# Patient Record
Sex: Male | Born: 1965 | Race: White | Hispanic: No | Marital: Single | Smoking: Current every day smoker
Health system: Southern US, Community
[De-identification: ages and names within clinical notes are randomized; demographics above are authoritative.]

## PROBLEM LIST (undated history)

## (undated) DIAGNOSIS — H9190 Unspecified hearing loss, unspecified ear: Secondary | ICD-10-CM

## (undated) HISTORY — PX: APPENDECTOMY: SHX54

## (undated) HISTORY — PX: COSMETIC SURGERY: SHX468

---

## 1998-02-19 ENCOUNTER — Emergency Department (HOSPITAL_COMMUNITY): Admission: EM | Admit: 1998-02-19 | Discharge: 1998-02-19 | Payer: Self-pay | Admitting: Emergency Medicine

## 1999-07-03 ENCOUNTER — Emergency Department (HOSPITAL_COMMUNITY): Admission: EM | Admit: 1999-07-03 | Discharge: 1999-07-03 | Payer: Self-pay | Admitting: Emergency Medicine

## 1999-07-03 ENCOUNTER — Encounter: Payer: Self-pay | Admitting: Emergency Medicine

## 2002-06-04 ENCOUNTER — Observation Stay (HOSPITAL_COMMUNITY): Admission: EM | Admit: 2002-06-04 | Discharge: 2002-06-04 | Payer: Self-pay | Admitting: Emergency Medicine

## 2002-06-09 ENCOUNTER — Encounter: Payer: Self-pay | Admitting: Emergency Medicine

## 2002-06-09 ENCOUNTER — Emergency Department (HOSPITAL_COMMUNITY): Admission: EM | Admit: 2002-06-09 | Discharge: 2002-06-09 | Payer: Self-pay | Admitting: Emergency Medicine

## 2002-06-12 ENCOUNTER — Emergency Department (HOSPITAL_COMMUNITY): Admission: EM | Admit: 2002-06-12 | Discharge: 2002-06-12 | Payer: Self-pay | Admitting: Emergency Medicine

## 2002-06-29 ENCOUNTER — Emergency Department (HOSPITAL_COMMUNITY): Admission: EM | Admit: 2002-06-29 | Discharge: 2002-06-29 | Payer: Self-pay | Admitting: Emergency Medicine

## 2002-07-02 ENCOUNTER — Emergency Department (HOSPITAL_COMMUNITY): Admission: EM | Admit: 2002-07-02 | Discharge: 2002-07-02 | Payer: Self-pay | Admitting: Emergency Medicine

## 2002-07-03 ENCOUNTER — Emergency Department (HOSPITAL_COMMUNITY): Admission: EM | Admit: 2002-07-03 | Discharge: 2002-07-03 | Payer: Self-pay | Admitting: Emergency Medicine

## 2002-07-03 ENCOUNTER — Encounter: Payer: Self-pay | Admitting: Emergency Medicine

## 2003-08-23 ENCOUNTER — Emergency Department (HOSPITAL_COMMUNITY): Admission: EM | Admit: 2003-08-23 | Discharge: 2003-08-23 | Payer: Self-pay | Admitting: Emergency Medicine

## 2003-08-26 ENCOUNTER — Emergency Department (HOSPITAL_COMMUNITY): Admission: EM | Admit: 2003-08-26 | Discharge: 2003-08-26 | Payer: Self-pay | Admitting: Emergency Medicine

## 2003-08-26 ENCOUNTER — Encounter: Payer: Self-pay | Admitting: Emergency Medicine

## 2003-09-01 ENCOUNTER — Emergency Department (HOSPITAL_COMMUNITY): Admission: AD | Admit: 2003-09-01 | Discharge: 2003-09-01 | Payer: Self-pay | Admitting: Emergency Medicine

## 2003-09-16 ENCOUNTER — Ambulatory Visit (HOSPITAL_COMMUNITY): Admission: RE | Admit: 2003-09-16 | Discharge: 2003-09-16 | Payer: Self-pay | Admitting: *Deleted

## 2003-09-27 ENCOUNTER — Encounter: Admission: RE | Admit: 2003-09-27 | Discharge: 2003-09-27 | Payer: Self-pay | Admitting: Orthopaedic Surgery

## 2003-10-09 ENCOUNTER — Encounter: Admission: RE | Admit: 2003-10-09 | Discharge: 2003-10-09 | Payer: Self-pay | Admitting: Orthopaedic Surgery

## 2012-07-17 ENCOUNTER — Encounter (HOSPITAL_COMMUNITY): Payer: Self-pay | Admitting: *Deleted

## 2012-07-17 ENCOUNTER — Observation Stay (HOSPITAL_COMMUNITY)
Admission: EM | Admit: 2012-07-17 | Discharge: 2012-07-18 | Disposition: A | Discharge status: Home / Self Care | Payer: Self-pay | Attending: Emergency Medicine | Admitting: Emergency Medicine

## 2012-07-17 ENCOUNTER — Emergency Department (HOSPITAL_COMMUNITY): Payer: Self-pay

## 2012-07-17 DIAGNOSIS — M7989 Other specified soft tissue disorders: Secondary | ICD-10-CM | POA: Insufficient documentation

## 2012-07-17 DIAGNOSIS — L03113 Cellulitis of right upper limb: Secondary | ICD-10-CM

## 2012-07-17 DIAGNOSIS — IMO0002 Reserved for concepts with insufficient information to code with codable children: Principal | ICD-10-CM | POA: Insufficient documentation

## 2012-07-17 DIAGNOSIS — M79609 Pain in unspecified limb: Secondary | ICD-10-CM | POA: Insufficient documentation

## 2012-07-17 LAB — COMPREHENSIVE METABOLIC PANEL
ALT: 33 U/L (ref 0–53)
AST: 48 U/L — ABNORMAL HIGH (ref 0–37)
Albumin: 4.1 g/dL (ref 3.5–5.2)
Alkaline Phosphatase: 64 U/L (ref 39–117)
BUN: 12 mg/dL (ref 6–23)
CO2: 29 mEq/L (ref 19–32)
Calcium: 9.9 mg/dL (ref 8.4–10.5)
Chloride: 101 mEq/L (ref 96–112)
Creatinine, Ser: 1.13 mg/dL (ref 0.50–1.35)
GFR calc Af Amer: 89 mL/min — ABNORMAL LOW (ref >90)
GFR calc non Af Amer: 77 mL/min — ABNORMAL LOW (ref >90)
Glucose, Bld: 77 mg/dL (ref 70–99)
Potassium: 3.7 mEq/L (ref 3.5–5.1)
Sodium: 139 mEq/L (ref 135–145)
Total Bilirubin: 0.3 mg/dL (ref 0.3–1.2)
Total Protein: 7.5 g/dL (ref 6.0–8.3)

## 2012-07-17 LAB — CBC WITH DIFFERENTIAL/PLATELET
Basophils Absolute: 0.1 10*3/uL (ref 0.0–0.1)
Basophils Relative: 0 % (ref 0–1)
Eosinophils Absolute: 0.5 10*3/uL (ref 0.0–0.7)
Eosinophils Relative: 5 % (ref 0–5)
HCT: 48.2 % (ref 39.0–52.0)
Hemoglobin: 16.7 g/dL (ref 13.0–17.0)
Lymphocytes Relative: 38 % (ref 12–46)
Lymphs Abs: 4.3 10*3/uL — ABNORMAL HIGH (ref 0.7–4.0)
MCH: 31.2 pg (ref 26.0–34.0)
MCHC: 34.6 g/dL (ref 30.0–36.0)
MCV: 89.9 fL (ref 78.0–100.0)
Monocytes Absolute: 0.7 10*3/uL (ref 0.1–1.0)
Monocytes Relative: 6 % (ref 3–12)
Neutro Abs: 5.6 10*3/uL (ref 1.7–7.7)
Neutrophils Relative %: 50 % (ref 43–77)
Platelets: 252 10*3/uL (ref 150–400)
RBC: 5.36 MIL/uL (ref 4.22–5.81)
RDW: 12.6 % (ref 11.5–15.5)
WBC: 11.2 10*3/uL — ABNORMAL HIGH (ref 4.0–10.5)

## 2012-07-17 MED ORDER — VANCOMYCIN HCL IN DEXTROSE 1-5 GM/200ML-% IV SOLN
1000.0000 mg | Freq: Two times a day (BID) | INTRAVENOUS | Status: DC
  Administered 2012-07-18: 1000 mg via INTRAVENOUS
  Filled 2012-07-17: qty 200

## 2012-07-17 MED ORDER — ONDANSETRON HCL 4 MG/2ML IJ SOLN: 4.0000 mg | Freq: Four times a day (QID) | INTRAMUSCULAR | Status: DC | PRN

## 2012-07-17 MED ORDER — OXYCODONE-ACETAMINOPHEN 5-325 MG PO TABS
2.0000 | ORAL_TABLET | Freq: Once | ORAL | Status: AC
  Administered 2012-07-17: 2 via ORAL
  Filled 2012-07-17: qty 2

## 2012-07-17 MED ORDER — SODIUM CHLORIDE 0.9 % IV SOLN: 20.0000 mL | INTRAVENOUS | Status: DC

## 2012-07-17 MED ORDER — ZOLPIDEM TARTRATE 5 MG PO TABS: 5.0000 mg | ORAL_TABLET | Freq: Every evening | ORAL | Status: DC | PRN

## 2012-07-17 MED ORDER — OXYCODONE-ACETAMINOPHEN 5-325 MG PO TABS
1.0000 | ORAL_TABLET | ORAL | Status: DC | PRN
  Administered 2012-07-18: 1 via ORAL
  Filled 2012-07-17: qty 1

## 2012-07-17 MED ORDER — VANCOMYCIN HCL IN DEXTROSE 1-5 GM/200ML-% IV SOLN
1000.0000 mg | Freq: Once | INTRAVENOUS | Status: AC
  Administered 2012-07-17: 1000 mg via INTRAVENOUS
  Filled 2012-07-17: qty 200

## 2012-07-17 NOTE — ED Notes (Signed)
Pt soaking hands in warm soapy water to clean grease and dirt off hands.

## 2012-07-17 NOTE — ED Provider Notes (Signed)
Medical screening examination/treatment/procedure(s) were performed by non-physician practitioner and as supervising physician I was immediately available for consultation/collaboration.   Celene Kras, MD 07/17/12 [phone]
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
## 2012-07-18 ENCOUNTER — Ambulatory Visit: Admit: 2012-07-18 | Payer: Self-pay | Admitting: Orthopedic Surgery

## 2012-07-18 ENCOUNTER — Encounter (HOSPITAL_COMMUNITY)
Admission: EM | Disposition: A | Discharge status: Home / Self Care | Payer: Self-pay | Source: Home / Self Care | Attending: Emergency Medicine

## 2012-07-18 LAB — CBC WITH DIFFERENTIAL/PLATELET
Basophils Absolute: 0 10*3/uL (ref 0.0–0.1)
Basophils Relative: 0 % (ref 0–1)
Eosinophils Absolute: 0.4 10*3/uL (ref 0.0–0.7)
Eosinophils Relative: 4 % (ref 0–5)
HCT: 47 % (ref 39.0–52.0)
Hemoglobin: 16.2 g/dL (ref 13.0–17.0)
Lymphocytes Relative: 41 % (ref 12–46)
Lymphs Abs: 3.7 10*3/uL (ref 0.7–4.0)
MCH: 31 pg (ref 26.0–34.0)
MCHC: 34.5 g/dL (ref 30.0–36.0)
MCV: 90 fL (ref 78.0–100.0)
Monocytes Absolute: 0.6 10*3/uL (ref 0.1–1.0)
Monocytes Relative: 7 % (ref 3–12)
Neutro Abs: 4.2 10*3/uL (ref 1.7–7.7)
Neutrophils Relative %: 47 % (ref 43–77)
Platelets: 220 10*3/uL (ref 150–400)
RBC: 5.22 MIL/uL (ref 4.22–5.81)
RDW: 12.4 % (ref 11.5–15.5)
WBC: 8.9 10*3/uL (ref 4.0–10.5)

## 2012-07-18 LAB — CBC
HCT: 46.2 % (ref 39.0–52.0)
Hemoglobin: 16.1 g/dL (ref 13.0–17.0)
MCH: 31 pg (ref 26.0–34.0)
MCHC: 34.8 g/dL (ref 30.0–36.0)
MCV: 88.8 fL (ref 78.0–100.0)
Platelets: 202 10*3/uL (ref 150–400)
RBC: 5.2 MIL/uL (ref 4.22–5.81)
RDW: 12.5 % (ref 11.5–15.5)
WBC: 8.8 10*3/uL (ref 4.0–10.5)

## 2012-07-18 SURGERY — IRRIGATION AND DEBRIDEMENT EXTREMITY
Anesthesia: General | Laterality: Right

## 2012-07-18 MED ORDER — HYDROMORPHONE HCL PF 1 MG/ML IJ SOLN
1.0000 mg | Freq: Once | INTRAMUSCULAR | Status: AC
  Administered 2012-07-18: 1 mg via INTRAVENOUS
  Filled 2012-07-18: qty 1

## 2012-07-18 MED ORDER — SULFAMETHOXAZOLE-TRIMETHOPRIM 800-160 MG PO TABS: 1.0000 | ORAL_TABLET | Freq: Two times a day (BID) | ORAL | Status: DC

## 2012-07-18 NOTE — ED Notes (Signed)
Spoke to [place] office and pt is on for surgery for after 5.

## 2012-07-18 NOTE — ED Provider Notes (Signed)
Handoff from Dr. Manus Gunning.   Patient holding in CDU on cellulitis protocol for IV abx and ortho hand consult later today.   Patient seen and examined. No change overnight.   Exam:  Gen NAD; Heart RRR, nml S1,S2, no m/r/g; Lungs CTAB; Abd soft, NT, no rebound or guarding; Ext 2+ radial pulses bilaterally, redness over R 2nd MCP joint with swelling and tenderness of 2nd and 3rd digits. There is some pain with active extension of both, however both can be extended.   2:58 PM Dr. Mina Marble to see at around 4pm. Will check additional CBC. Next dose Vanc scheduled for approx 8pm.   3:55 PM Handoff to [facility] NP. Pending hand eval and WBC count.     Renne Crigler, [place] 07/18/12 1555

## 2012-07-18 NOTE — Consult Note (Signed)
Reason for Consult  Right hand infection Referring Physician: Sharen Hones NP  Jack Carroll is an 46 y.o. male.  HPI: as above with h/o self treated right hand lac around 4-5 dys ago  History reviewed. No pertinent past medical history.  History reviewed. No pertinent past surgical history.  No family history on file.  Social History:  reports that he has been smoking.  He does not have any smokeless tobacco history on file. He reports that he drinks alcohol. His drug history not on file.  Allergies:  Allergies  Allergen Reactions  . Penicillins Rash    Medications: Prior to Admission:  (Not in a hospital admission)  Results for orders placed during the hospital encounter of 07/17/12 (from the past 48 hour(s))  CBC WITH DIFFERENTIAL     Status: Abnormal   Collection Time   07/17/12  6:37 PM      Component Value Range Comment   WBC 11.2 (*) 4.0 - 10.5 K/uL    RBC 5.36  4.22 - 5.81 MIL/uL    Hemoglobin 16.7  13.0 - 17.0 g/dL    HCT 78.2  95.6 - 21.3 %    MCV 89.9  78.0 - 100.0 fL    MCH 31.2  26.0 - 34.0 pg    MCHC 34.6  30.0 - 36.0 g/dL    RDW 08.6  57.8 - 46.9 %    Platelets 252  150 - 400 K/uL    Neutrophils Relative 50  43 - 77 %    Neutro Abs 5.6  1.7 - 7.7 K/uL    Lymphocytes Relative 38  12 - 46 %    Lymphs Abs 4.3 (*) 0.7 - 4.0 K/uL    Monocytes Relative 6  3 - 12 %    Monocytes Absolute 0.7  0.1 - 1.0 K/uL    Eosinophils Relative 5  0 - 5 %    Eosinophils Absolute 0.5  0.0 - 0.7 K/uL    Basophils Relative 0  0 - 1 %    Basophils Absolute 0.1  0.0 - 0.1 K/uL   COMPREHENSIVE METABOLIC PANEL     Status: Abnormal   Collection Time   07/17/12  6:37 PM      Component Value Range Comment   Sodium 139  135 - 145 mEq/L    Potassium 3.7  3.5 - 5.1 mEq/L    Chloride 101  96 - 112 mEq/L    CO2 29  19 - 32 mEq/L    Glucose, Bld 77  70 - 99 mg/dL    BUN 12  6 - 23 mg/dL    Creatinine, Ser 6.29  0.50 - 1.35 mg/dL    Calcium 9.9  8.4 - 52.8 mg/dL    Total Protein  7.5  6.0 - 8.3 g/dL    Albumin 4.1  3.5 - 5.2 g/dL    AST 48 (*) 0 - 37 U/L    ALT 33  0 - 53 U/L    Alkaline Phosphatase 64  39 - 117 U/L    Total Bilirubin 0.3  0.3 - 1.2 mg/dL    GFR calc non Af Amer 77 (*) >90 mL/min    GFR calc Af Amer 89 (*) >90 mL/min   CBC     Status: Normal   Collection Time   07/18/12  6:59 AM      Component Value Range Comment   WBC 8.8  4.0 - 10.5 K/uL    RBC 5.20  4.22 - 5.81 MIL/uL    Hemoglobin 16.1  13.0 - 17.0 g/dL    HCT 81.1  91.4 - 78.2 %    MCV 88.8  78.0 - 100.0 fL    MCH 31.0  26.0 - 34.0 pg    MCHC 34.8  30.0 - 36.0 g/dL    RDW 95.6  21.3 - 08.6 %    Platelets 202  150 - 400 K/uL   CBC WITH DIFFERENTIAL     Status: Normal   Collection Time   07/18/12  3:17 PM      Component Value Range Comment   WBC 8.9  4.0 - 10.5 K/uL    RBC 5.22  4.22 - 5.81 MIL/uL    Hemoglobin 16.2  13.0 - 17.0 g/dL    HCT 57.8  46.9 - 62.9 %    MCV 90.0  78.0 - 100.0 fL    MCH 31.0  26.0 - 34.0 pg    MCHC 34.5  30.0 - 36.0 g/dL    RDW 52.8  41.3 - 24.4 %    Platelets 220  150 - 400 K/uL    Neutrophils Relative 47  43 - 77 %    Neutro Abs 4.2  1.7 - 7.7 K/uL    Lymphocytes Relative 41  12 - 46 %    Lymphs Abs 3.7  0.7 - 4.0 K/uL    Monocytes Relative 7  3 - 12 %    Monocytes Absolute 0.6  0.1 - 1.0 K/uL    Eosinophils Relative 4  0 - 5 %    Eosinophils Absolute 0.4  0.0 - 0.7 K/uL    Basophils Relative 0  0 - 1 %    Basophils Absolute 0.0  0.0 - 0.1 K/uL     Dg Hand Complete Right  07/17/2012  *RADIOLOGY REPORT*  Clinical Data: Laceration with swelling  RIGHT HAND - COMPLETE 3+ VIEW  Comparison: None.  Findings: There is no evidence of fracture or dislocation.  There is no evidence of arthropathy or other focal bony abnormality. Dorsal soft tissue swelling centered over the second metacarpal. No visible radiopaque foreign body. No visible air in the soft tissues.  IMPRESSION: No acute osseous findings.  Soft tissue swelling.   Original Report Authenticated By:  Elsie Stain, M.D.     Review of Systems  All other systems reviewed and are negative.   Blood pressure 115/80, pulse 59, temperature 97.9 F (36.6 C), temperature source Oral, resp. rate 16, SpO2 97.00%. Physical Exam  Constitutional: He is oriented to person, place, and time. He appears well-developed and well-nourished.  HENT:  Head: Normocephalic and atraumatic.  Cardiovascular: Normal rate.   Respiratory: Effort normal.  Musculoskeletal:       Hands: Neurological: He is alert and oriented to person, place, and time.  Skin: Skin is warm.  Psychiatric: He has a normal mood and affect. His behavior is normal. Judgment and thought content normal.    Assessment/Plan: Doing much better on IV vancomycin  Would finish cellulitis protocol and d/c on po abx  followup in my office thursday  [facility] A 07/18/2012, 5:07 PM

## 2012-07-18 NOTE — ED Notes (Signed)
Up to walk in halls w/ family

## 2012-07-18 NOTE — ED Provider Notes (Signed)
Patient seen and evaluated by Dr. Mina Marble.  Patient to be discharged home with prescription for bactrim and follow-up in the office on Thursday.  Dr. Mina Marble suggested patient would benefit from another dose of vancomycin, but it is not absolutely necessary if patient is not willing to stay.  Discussed with patient, next dose is not due until 2300--patient would prefer to go home.  Jimmye Norman, NP 07/18/12 2325

## 2012-07-18 NOTE — ED Notes (Signed)
Per dr Mina Marble office pt is now npo til / surgery later today pt is aware

## 2012-07-18 NOTE — ED Provider Notes (Signed)
Medical screening examination/treatment/procedure(s) were performed by non-physician practitioner and as supervising physician I was immediately available for consultation/collaboration.  Flint Melter, MD 07/18/12 [phone]

## 2012-07-18 NOTE — ED Notes (Signed)
Pt d/c home in [facility]. Pt voiced understanding of d/c instructions and follow up care. Pt instructed on importance of follow up care and finishing abx

## 2012-07-18 NOTE — Progress Notes (Signed)
   CARE MANAGEMENT ED NOTE 07/18/2012  Patient:  Jack Carroll, Jack Carroll   Account Number:  [id number]  Date Initiated:  07/18/2012  Documentation initiated by:  Fransico Michael  Subjective/Objective Assessment:     Subjective/Objective Assessment Detail:     Action/Plan:   Action/Plan Detail:   Anticipated DC Date:       Status Recommendation to Physician:   Result of Recommendation:      DC Planning Services  CM consult    Choice offered to / List presented to:            Status of service:  Completed, signed off  ED Comments:   ED Comments Detail:  07/18/12-1341-J.Jolan Mealor,RN,BSN [phone]      In to speak with paitent regariding home needs and lack of primary care physicians. Reports not having Carroll PCP and would like information on how to obtain one. List of resources for self pay patients given. Denies any further needs at home.

## 2012-07-18 NOTE — ED Notes (Signed)
Dr. Mina Marble at bedside to assess pt hands

## 2012-07-18 NOTE — Progress Notes (Signed)
Utilization review completed.  

## 2012-07-22 NOTE — ED Provider Notes (Signed)
Medical screening examination/treatment/procedure(s) were performed by non-physician practitioner and as supervising physician I was immediately available for consultation/collaboration.  Arnetia Bronk L Joselynn Amoroso, MD 07/22/12 1414 

## 2013-07-19 ENCOUNTER — Emergency Department (HOSPITAL_COMMUNITY)
Admission: EM | Admit: 2013-07-19 | Discharge: 2013-07-19 | Discharge status: Against Medical Advice | Payer: Self-pay | Attending: Emergency Medicine | Admitting: Emergency Medicine

## 2013-07-19 ENCOUNTER — Encounter (HOSPITAL_COMMUNITY): Payer: Self-pay | Admitting: *Deleted

## 2013-07-19 DIAGNOSIS — Y929 Unspecified place or not applicable: Secondary | ICD-10-CM | POA: Insufficient documentation

## 2013-07-19 DIAGNOSIS — Y9389 Activity, other specified: Secondary | ICD-10-CM | POA: Insufficient documentation

## 2013-07-19 DIAGNOSIS — F17200 Nicotine dependence, unspecified, uncomplicated: Secondary | ICD-10-CM | POA: Insufficient documentation

## 2013-07-19 DIAGNOSIS — S0510XA Contusion of eyeball and orbital tissues, unspecified eye, initial encounter: Secondary | ICD-10-CM | POA: Insufficient documentation

## 2013-07-19 DIAGNOSIS — X58XXXA Exposure to other specified factors, initial encounter: Secondary | ICD-10-CM | POA: Insufficient documentation

## 2013-07-19 MED ORDER — TETRACAINE HCL 0.5 % OP SOLN
1.0000 [drp] | Freq: Once | OPHTHALMIC | Status: DC
  Filled 2013-07-19: qty 2

## 2013-07-19 MED ORDER — FLUORESCEIN SODIUM 1 MG OP STRP
1.0000 | ORAL_STRIP | Freq: Once | OPHTHALMIC | Status: DC
  Filled 2013-07-19: qty 1

## 2013-07-19 NOTE — ED Notes (Signed)
PATIENT NOT IN ROOM
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
## 2013-07-19 NOTE — ED Notes (Signed)
Pt was witnessed leaving the facility.

## 2013-11-22 ENCOUNTER — Encounter (HOSPITAL_COMMUNITY): Payer: Self-pay | Admitting: Emergency Medicine

## 2013-11-22 ENCOUNTER — Emergency Department (HOSPITAL_COMMUNITY): Payer: Self-pay

## 2013-11-22 ENCOUNTER — Emergency Department (HOSPITAL_COMMUNITY)
Admission: EM | Admit: 2013-11-22 | Discharge: 2013-11-22 | Disposition: A | Discharge status: Home / Self Care | Payer: Self-pay | Attending: Emergency Medicine | Admitting: Emergency Medicine

## 2013-11-22 DIAGNOSIS — R079 Chest pain, unspecified: Secondary | ICD-10-CM | POA: Insufficient documentation

## 2013-11-22 DIAGNOSIS — J3489 Other specified disorders of nose and nasal sinuses: Secondary | ICD-10-CM | POA: Insufficient documentation

## 2013-11-22 DIAGNOSIS — Z79899 Other long term (current) drug therapy: Secondary | ICD-10-CM | POA: Insufficient documentation

## 2013-11-22 DIAGNOSIS — F17200 Nicotine dependence, unspecified, uncomplicated: Secondary | ICD-10-CM | POA: Insufficient documentation

## 2013-11-22 DIAGNOSIS — Z880 Allergy status to penicillin: Secondary | ICD-10-CM | POA: Insufficient documentation

## 2013-11-22 DIAGNOSIS — R05 Cough: Secondary | ICD-10-CM

## 2013-11-22 DIAGNOSIS — R059 Cough, unspecified: Secondary | ICD-10-CM | POA: Insufficient documentation

## 2013-11-22 MED ORDER — ALBUTEROL SULFATE HFA 108 (90 BASE) MCG/ACT IN AERS
2.0000 | INHALATION_SPRAY | RESPIRATORY_TRACT | Status: DC | PRN
  Administered 2013-11-22: 2 via RESPIRATORY_TRACT
  Filled 2013-11-22: qty 6.7

## 2013-11-22 MED ORDER — AZITHROMYCIN 250 MG PO TABS
500.0000 mg | ORAL_TABLET | Freq: Once | ORAL | Status: AC
  Administered 2013-11-22: 500 mg via ORAL
  Filled 2013-11-22: qty 2

## 2013-11-22 MED ORDER — AZITHROMYCIN 250 MG PO TABS: 250.0000 mg | ORAL_TABLET | Freq: Every day | ORAL | Status: AC

## 2013-11-22 MED ORDER — BENZONATATE 100 MG PO CAPS: 100.0000 mg | ORAL_CAPSULE | Freq: Three times a day (TID) | ORAL | Status: AC

## 2013-11-22 NOTE — ED Provider Notes (Signed)
CSN: [id number]     Arrival date & time 11/22/13  1527 History  This chart was scribed for non-physician practitioner, Rhea Bleacher, PA-C working with Richardean Canal, MD by Greggory Stallion, ED scribe. This patient was seen in room WTR8/WTR8 and the patient's care was started at 5:50 PM.   Chief Complaint  Patient presents with  . Cough   The history is provided by the patient. No language interpreter was used.   HPI Comments: Jack Carroll is a 48 y.o. male who presents to the Emergency Department complaining of productive cough of green sputum and congestion that started 3 days ago. He states he has substernal chest pain last night due to coughing, went to sleep and it was gone when he woke up. Pt denies it currently. He has taken mucinex, halls and nasal spray with no relief. Denies fever, sore throat, SOB, wheezing. Pt smokes cigarettes daily but quit 3 days ago due to his symptoms.   History reviewed. No pertinent past medical history. Past Surgical History  Procedure Laterality Date  . Cosmetic surgery    . Appendectomy     No family history on file. History  Substance Use Topics  . Smoking status: Current Every Day Smoker -- 2.00 packs/day    Types: Cigarettes  . Smokeless tobacco: Not on file  . Alcohol Use: Yes    Review of Systems  Constitutional: Negative for fever, chills, diaphoresis and fatigue.  HENT: Positive for congestion. Negative for ear pain, rhinorrhea, sinus pressure and sore throat.   Eyes: Negative for redness.  Respiratory: Positive for cough. Negative for shortness of breath and wheezing.   Cardiovascular: Negative for chest pain (no current CP), palpitations and leg swelling.  Gastrointestinal: Negative for nausea, vomiting, abdominal pain and diarrhea.  Genitourinary: Negative for dysuria.  Musculoskeletal: Negative for back pain, myalgias, neck pain and neck stiffness.  Skin: Negative for rash.  Neurological: Negative for syncope, light-headedness and  headaches.  Hematological: Negative for adenopathy.    Allergies  Codeine and Penicillins  Home Medications   Current Outpatient Rx  Name  Route  Sig  Dispense  Refill  . guaiFENesin (MUCINEX) 600 MG 12 hr tablet   Oral   Take 600 mg by mouth 2 (two) times daily.         Marland Kitchen oxymetazoline (AFRIN) 0.05 % nasal spray   Each Nare   Place 2 sprays into both nostrils 2 (two) times daily as needed for congestion.          BP 123/82  Pulse 90  Temp(Src) 99 F (37.2 C) (Oral)  Resp 23  SpO2 97%  Physical Exam  Nursing note and vitals reviewed. Constitutional: He is oriented to person, place, and time. He appears well-developed and well-nourished. No distress.  HENT:  Head: Normocephalic and atraumatic.  Right Ear: Tympanic membrane, external ear and ear canal normal.  Left Ear: Tympanic membrane, external ear and ear canal normal.  Nose: Nose normal. No mucosal edema or rhinorrhea.  Mouth/Throat: Uvula is midline, oropharynx is clear and moist and mucous membranes are normal. Mucous membranes are not dry. No trismus in the jaw. No uvula swelling. No oropharyngeal exudate, posterior oropharyngeal edema, posterior oropharyngeal erythema or tonsillar abscesses.  Eyes: Conjunctivae and EOM are normal. Right eye exhibits no discharge. Left eye exhibits no discharge.  Neck: Normal range of motion. Neck supple. No tracheal deviation present.  Cardiovascular: Normal rate, regular rhythm and normal heart sounds.   Pulmonary/Chest: Effort normal. No  respiratory distress. He has no wheezes. He has rhonchi (scattered, mild). He has no rales. He exhibits no tenderness.  Abdominal: Soft. There is no tenderness.  Musculoskeletal: Normal range of motion.  Neurological: He is alert and oriented to person, place, and time.  Skin: Skin is warm and dry.  Psychiatric: He has a normal mood and affect. His behavior is normal.    ED Course  Procedures (including critical care time)  DIAGNOSTIC  STUDIES: Oxygen Saturation is 97% on RA, normal by my interpretation.    COORDINATION OF CARE: 5:54 PM-Discussed treatment plan which includes an antibiotic and an inhaler with pt at bedside and pt agreed to plan.   Labs Review Labs Reviewed - No data to display Imaging Review Dg Chest 2 View  11/22/2013   CLINICAL DATA:  Cough, fever  EXAM: CHEST  2 VIEW  COMPARISON:  None  FINDINGS: Cardiomediastinal silhouette is unremarkable. There is streaky left infrahilar increased bronchial markings suspicious for bronchitic changes or early infiltrate. Follow-up examination is recommended. No pulmonary edema.  IMPRESSION: There is streaky left infrahilar increased bronchial markings suspicious for bronchitic changes or early infiltrate. Follow-up examination is recommended. No pulmonary edema.   Electronically Signed   By: Natasha MeadLiviu  Pop M.D.   On: 11/22/2013 16:47    EKG Interpretation   None       Date: 11/22/2013  Rate: 93  Rhythm: normal sinus rhythm  QRS Axis: normal  Intervals: normal  ST/T Wave abnormalities: normal  Conduction Disutrbances:none  Narrative Interpretation:   Old EKG Reviewed: none available  Vital signs reviewed and are as follows: Filed Vitals:   11/22/13 1831  BP:   Pulse: 78  Temp:   Resp: 20  BP 123/82  Pulse 78  Temp(Src) 99 F (37.2 C) (Oral)  Resp 20  SpO2 95%  CXR reviewed. Pt informed of results. Azithromycin ordered.   Patient counseled on use of albuterol HFA.  Told to use 1-2 puffs q 4 hours as needed for SOB.    MDM   1. Cough    Patient with likely bronchitis however CXR shows possible early infiltrate so cannot r/o PNA and will treat. Patient with transient CP yesterday with cough that is not suggestive of ACS. No pain today. Screening EKG ordered and does not show ischemic change. No further CP work-up. No concerning risk factors for PE. Symptomatic therapy given.      I personally performed the services described in this  documentation, which was scribed in my presence. The recorded information has been reviewed and is accurate.   Renne CriglerJoshua Kristi Norment, PA-C 11/22/13 1904

## 2013-11-22 NOTE — Discharge Instructions (Signed)
Please read and follow all provided instructions.  Your diagnoses today include:  1. Cough    Tests performed today include:  Chest x-ray - shows bronchitis changes and possible early pneumonia  Vital signs. See below for your results today.   Medications prescribed:   Albuterol inhaler - medication that opens up your airway  Use inhaler as follows: 1-2 puffs with spacer every 4 hours as needed for wheezing, cough, or shortness of breath.    Azithromycin - antibiotic for respiratory infection  You have been prescribed an antibiotic medicine: take the entire course of medicine even if you are feeling better. Stopping early can cause the antibiotic not to work.   Tessalon Perles - cough suppressant medication  Take any prescribed medications only as directed.  Home care instructions:  Follow any educational materials contained in this packet.  Follow-up instructions: Please follow-up with your primary care provider in the next 3 days for further evaluation of your symptoms and a recheck if you are not feeling better. If you do not have a primary care doctor -- see below for referral information.   Return instructions:   Please return to the Emergency Department if you experience worsening symptoms.  Please return with worsening wheezing, shortness of breath, or difficulty breathing.  Return with persistent fever above 101F.   Please return if you have any other emergent concerns.  Additional Information:  Your vital signs today were: BP 123/82   Pulse 90   Temp(Src) 99 F (37.2 C) (Oral)   Resp 23   SpO2 97% If your blood pressure (BP) was elevated above 135/85 this visit, please have this repeated by your doctor within one month. --------------  Emergency Department Resource Guide 1) Find a Doctor and Pay Out of Pocket Although you won't have to find out who is covered by your insurance plan, it is a good idea to ask around and get recommendations. You will then  need to call the office and see if the doctor you have chosen will accept you as a new patient and what types of options they offer for patients who are self-pay. Some doctors offer discounts or will set up payment plans for their patients who do not have insurance, but you will need to ask so you aren't surprised when you get to your appointment.  2) Contact Your Local Health Department Not all health departments have doctors that can see patients for sick visits, but many do, so it is worth a call to see if yours does. If you don't know where your local health department is, you can check in your phone book. The CDC also has a tool to help you locate your state's health department, and many state websites also have listings of all of their local health departments.  3) Find a Walk-in Clinic If your illness is not likely to be very severe or complicated, you may want to try a walk in clinic. These are popping up all over the country in pharmacies, drugstores, and shopping centers. They're usually staffed by nurse practitioners or physician assistants that have been trained to treat common illnesses and complaints. They're usually fairly quick and inexpensive. However, if you have serious medical issues or chronic medical problems, these are probably not your best option.  No Primary Care Doctor: - Call Health Connect at  [phone] - they can help you locate a primary care doctor that  accepts your insurance, provides certain services, etc. - Physician Referral Service- [phone]  Chronic  Pain Problems: Organization         Address  Phone   Notes  [facility]  [phone] Patients need to be referred by their primary care doctor.   Medication Assistance: Organization         Address  Phone   Notes  [facility] Medication [facility] [address] [place] [place], [place] [address] [phone] --Must be a resident of [facility] -- Must have NO  insurance coverage whatsoever (no Medicaid/ Medicare, etc.) -- The pt. MUST have a primary care doctor that directs their care regularly and follows them in the community   [facility]ist  [phone]   [employer]  [phone]    Agencies that provide inexpensive medical care: Organization         Address  Phone   Notes  [facility]  [phone]   [facility]    [phone]   [facility] [address] [place], [place] [address] [phone]   [facility] [address] [place], [place] [phone]   Planned Parenthood    [phone]   [facility]    [phone]   [facility]  [address], [place] Phone:  [phone], Fax:  [phone] Hours of Operation:  9 am - 6 pm, M-F.  Also accepts Medicaid/Medicare and self-pay.  [facility]  [address], [place] Phone: [phone], Fax: [phone]. Hours of Operation:  8:30 am - 5:30 pm, M-F.  Also accepts Medicaid and self-pay.  [facility] [place] [address], [place] Phone: [phone]   [facility] [address] Natasha Bence [place], [place] [phone], Ext. [phone] Mondays & Thursdays: 7-9 AM.  First 15 patients are seen on a first come, first serve basis.    Medicaid-accepting [facility] Providers:  Organization         Address  Phone   Notes  [facility] [address],  [phone] Also accepts self-pay patients.  [facility] [address] Laurell Josephs [place], [place]  [phone]   [facility] [address], [place] [phone]   [facility] [address] [place] [phone]   Renaye Rakers [address], [place]   [phone] Only accepts [place] Access [place] patients after they have  their name applied to their card.   Self-Pay (no insurance) in [facility]:  Organization         Address  Phone   Notes  Sickle Cell Patients, [facility] [address] [place], [place] [phone]   [facility] [address] [place], [place] [phone]   [facility] [place]  [address], [place] [phone]   [facility]/Dr. Osei-Bonsu  [address] [place] or [address], [place] [phone] Phone number for both [place] and [place] locations is the same.  [facility] [address], [place] [phone]   [facility] [address] [place] or [address] [phone]   [facility] [address], [place] [phone], phone; [phone], fax Sees patients  1st and 3rd Saturday of every month.  Must not qualify for public or private insurance (i.e. Medicaid, Medicare, [place] Health Choice, Veterans' Benefits)  Household income should be no more than 200% of the poverty level The clinic cannot treat you if you are pregnant or think you are pregnant  Sexually transmitted diseases are not treated at the clinic.    Dental Care: Organization         Address  Phone  Notes  [facility] Department of [facility] [address] [place], [place] [phone] Accepts children up to age 90 who are enrolled in [place] or [place] Health Choice; pregnant women with a Medicaid card; and children who have applied for Medicaid or [place] Health Choice, but were declined, whose parents can pay a reduced fee at time of service.  [employer] Department of [facility]  [address], [place] [phone] Accepts children up to age 52 who are enrolled in [place] or [place] Health Choice; pregnant women with a Medicaid card; and children who have applied  for Medicaid or [place] Health Choice, but were declined, whose parents can pay a reduced fee at time of service.  [facility]  [address] [place], [place] [phone] Patients are seen by appointment only. Walk-ins are not accepted. [facility] will see patients 59 years of age and older. Monday - Tuesday (8am-5pm) Most Wednesdays (8:30-5pm) $30 per visit, cash only  [facility] Adult Dental Access [facility]  [address], [facility] [phone] Patients are seen by appointment only. Walk-ins are not accepted. [facility] will see patients 30 years of age and older. One Wednesday Evening (Monthly: Volunteer Based).  $30 per visit, cash only  [employer]  [phone] for adults; Children under age 60, call Graduate Pediatric Dentistry at [phone]. Children aged 61-14, please call [phone] to request a pediatric application.  Dental services are provided in all areas of dental care including fillings, crowns and bridges, complete and partial dentures, implants, gum treatment, root canals, and extractions. Preventive care is also provided. Treatment is provided to both adults and children. Patients are selected via a lottery and there is often a waiting list.   [facility] [address], [place]  [phone] [email or web address]   [facility] [address] [place], [place] [phone], Ext. [phone] Second and Fourth Thursday of each month, opens at 6:30 AM; Clinic ends at 9 AM.  Patients are seen on a first-come first-served basis, and a limited number are seen during each clinic.   [facility]  [address] Ether Griffins [place], [place] [phone]   Eligibility Requirements You must have lived in [place], [place], or [place] counties for at least the last three months.   You cannot be eligible for state or federal sponsored [place], including [employer],  [place], or [employer].   You generally cannot be eligible for healthcare insurance through your employer.    How to apply: Eligibility screenings are held every Tuesday and Wednesday afternoon from 1:00 pm until 4:00 pm. You do not need an appointment for the interview!  [facility] [address] [place], [place] [phone]   [facility] Health Department  [phone]   [facility] Health Department  [phone]   [facility] Health Department  [phone]    Behavioral Health Resources in the Community: Intensive Outpatient Programs Organization  Address  Phone  Notes  [facility] [address], [place], [place] [phone]   [facility] Outpatient [address] [place], [place] [phone]   [facility] [address] [place], [place]  [phone]   [facility] Mental Health [address]  [place], [place] [phone] or [phone]   Substance Abuse Resources Organization         Address  Phone  Notes  [facility]  [phone]   [facility]  [phone]   [facility] [place]  [phone]   Floydene Flock  [phone]   Residential & Outpatient Substance Abuse Program  [phone]   Psychological Services Organization         Address  Phone  Notes  [facility] Behavioral Health  [phone]   [facility] Services  [phone]   [facility] [address], [place] [phone] or [phone]    Mobile Crisis Teams Organization         Address  Phone  Notes  [facility], Mobile Crisis Care Unit  [phone]   [facility]  [address] [place], [place] [phone]   Doristine Locks [address] [place] [phone]    Self-Help/Support Groups Organization         Address  Phone             Notes  [facility] [place] - variety of support  groups  336- I7437963 Call for more information  Narcotics Anonymous (NA), [facility] [address], [employer] [place]  2 meetings at this location   Statistician         Address  Phone  Notes  [facility] [address],    [place]  [phone]   [facility]  [address], [place] [address], [place], [place] [phone]   [facility] [address] [place], [place] [phone] Admissions: 8am-3pm M-F  [facility] [address],    [place], [place] [phone]   [facility] [address] [place], [place], [place] [phone]   [facility] [address]  [place], [place] [phone]   [facility] - Intensive Outpatient [address] Laurell Josephs [address], [place], [place] [phone]   [facility] ([facility] [address] [place]  [place], [place] [phone] or [phone]   [facility] ([facility]) [address], [place], [place] [phone] Accepts Medicaid  [facility] [address],  [place] [phone] Substance Abuse/Addiction Treatment   [facility] Organization         Address  Phone  Notes  [facility]  [phone]   Angie Fava, PhD [address] Ervin Knack [place], [place]   [phone] or [phone]   [facility] Behavioral   [address] [place], [place] [phone]   [facility] [address] [place], [place] [phone] Insurance/Medicaid/sponsorship through [facility] [address]                                    [place], [place] [phone] Therapy/tele-psych/case  [facility] [address] [place] [phone]    Dr. Lolly Mustache  [phone]   [facility] [place]  [facility] Dept. 1) [address]  2) [address] [place] 3)   [address], [place] [phone]  [phone]   [facility] Child Abuse Hotline 657-295-1166 or [phone] (After Hours)

## 2013-11-22 NOTE — ED Notes (Addendum)
Pt a+ox4, presents from home, reports cough/cold symp "I think I have the flu" x2 days.  Pt reports cough prod green sputum.  Pt denies SOB, lsctab with good air movement.  Pt reports diffuse CP, nonradiating, reproducible with palp and cough, 5/10.  Pt denies other aches or pain.  Pt reports symp improve during the day and worsen at night.  Pt denies fevers/chills, n/v/d/c.

## 2013-11-22 NOTE — ED Provider Notes (Signed)
Medical screening examination/treatment/procedure(s) were performed by non-physician practitioner and as supervising physician I was immediately available for consultation/collaboration.  EKG Interpretation   None         Josemaria Brining H Johni Narine, MD 11/22/13 2332 

## 2013-11-24 IMAGING — CR DG HAND COMPLETE 3+V*R*
3 series · 3 of 3 positions shown · non-contrast
Comparison: None.

CLINICAL DATA: Laceration with swelling

RIGHT HAND - COMPLETE 3+ VIEW

[x hand pa right]
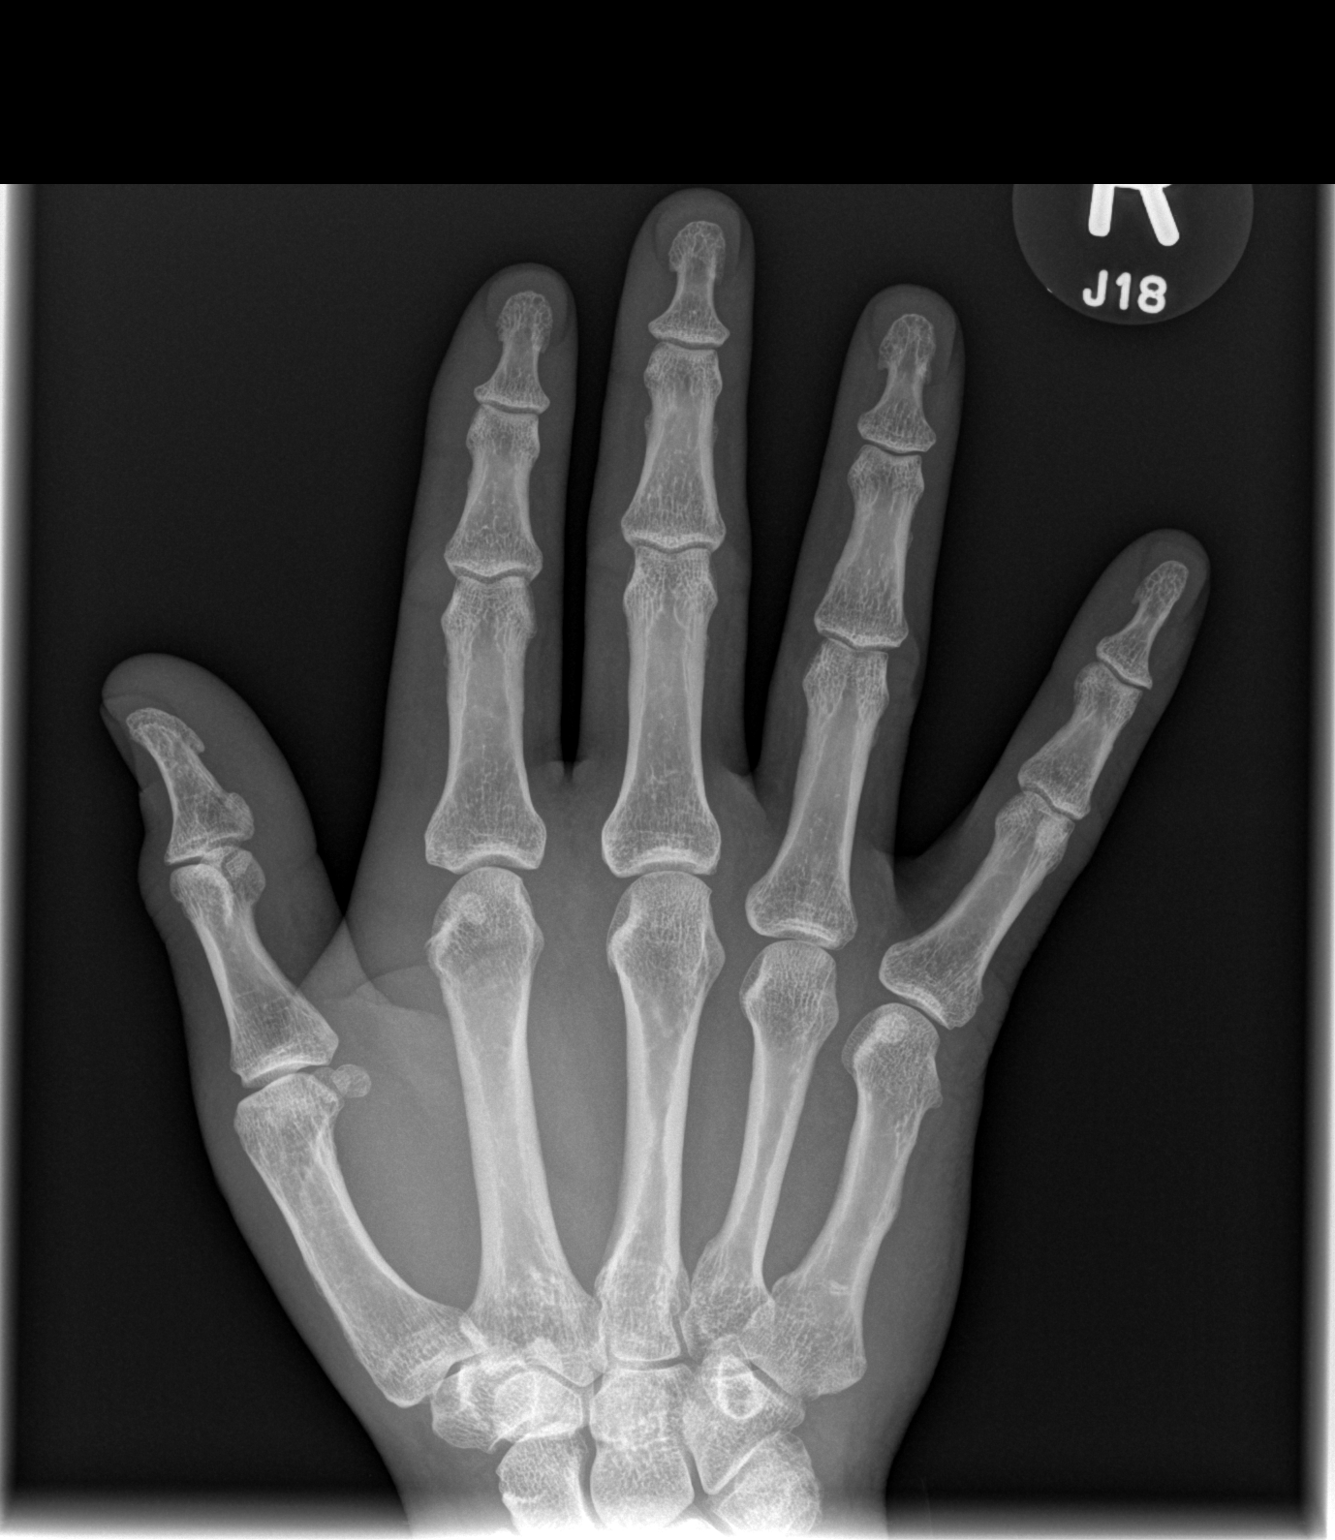

[x hand oblique right]
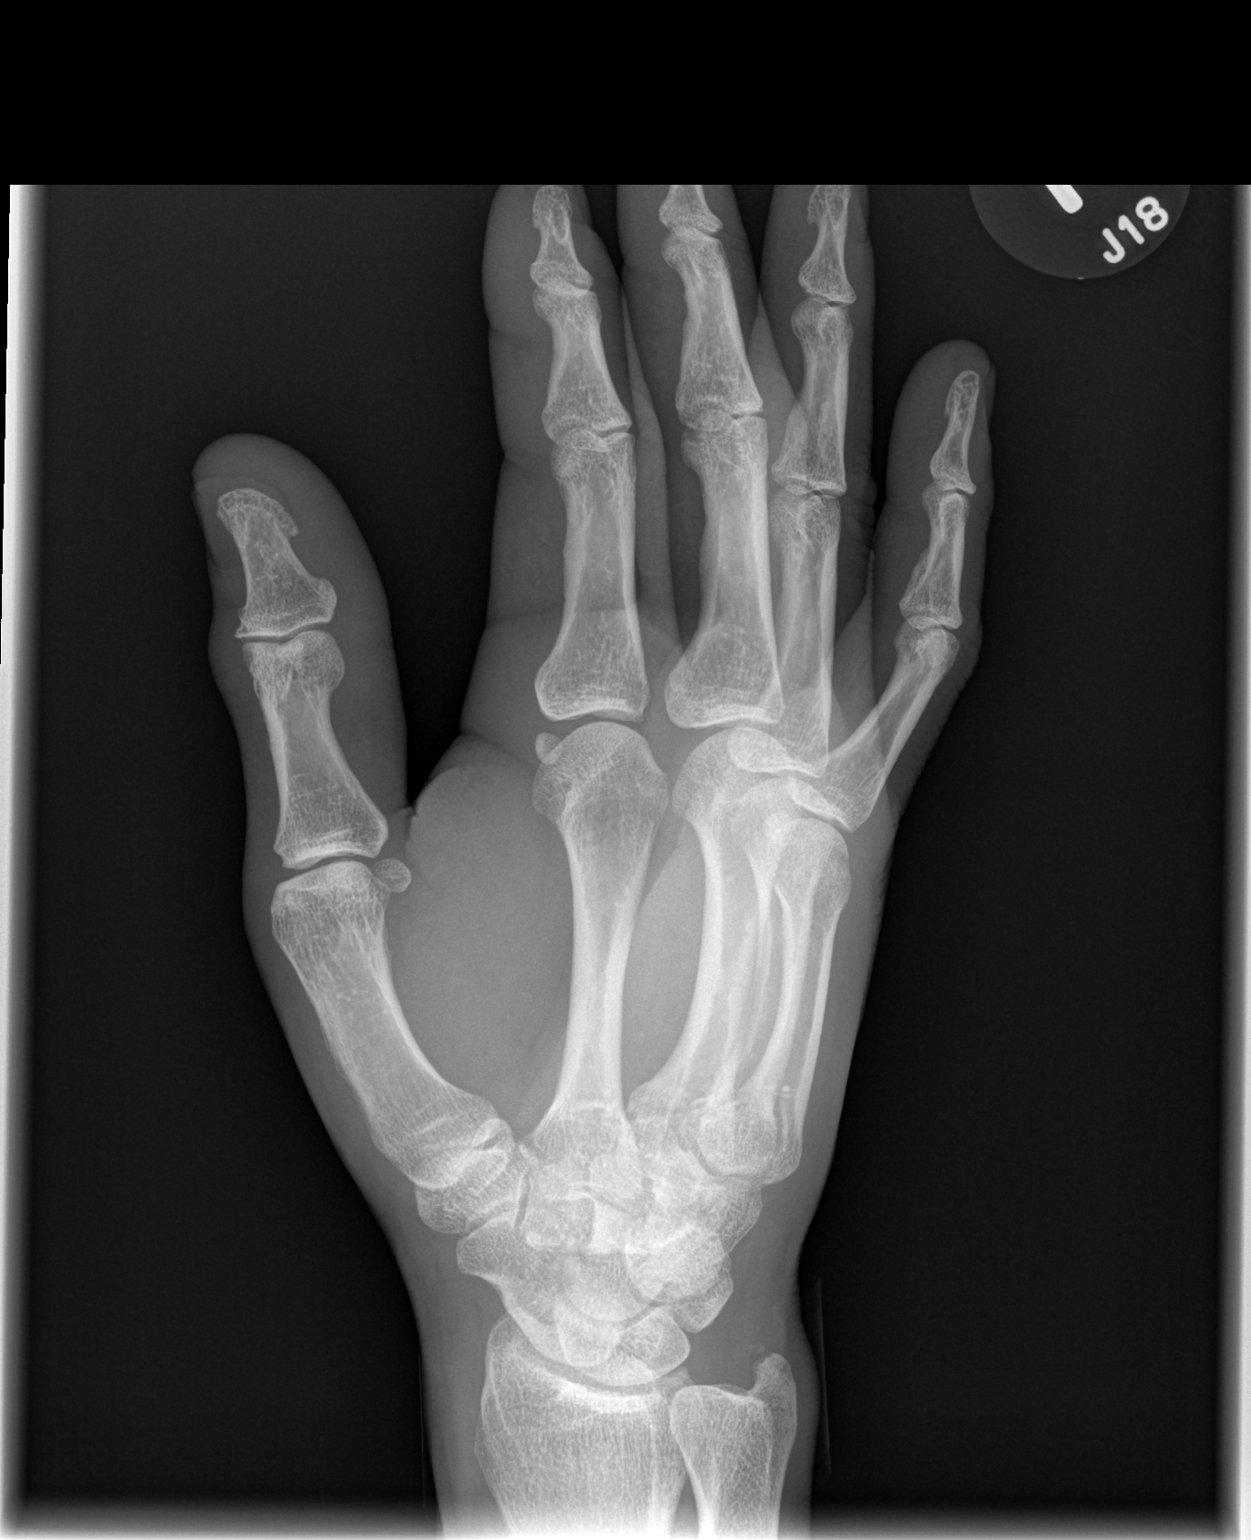

[x hand lat right]
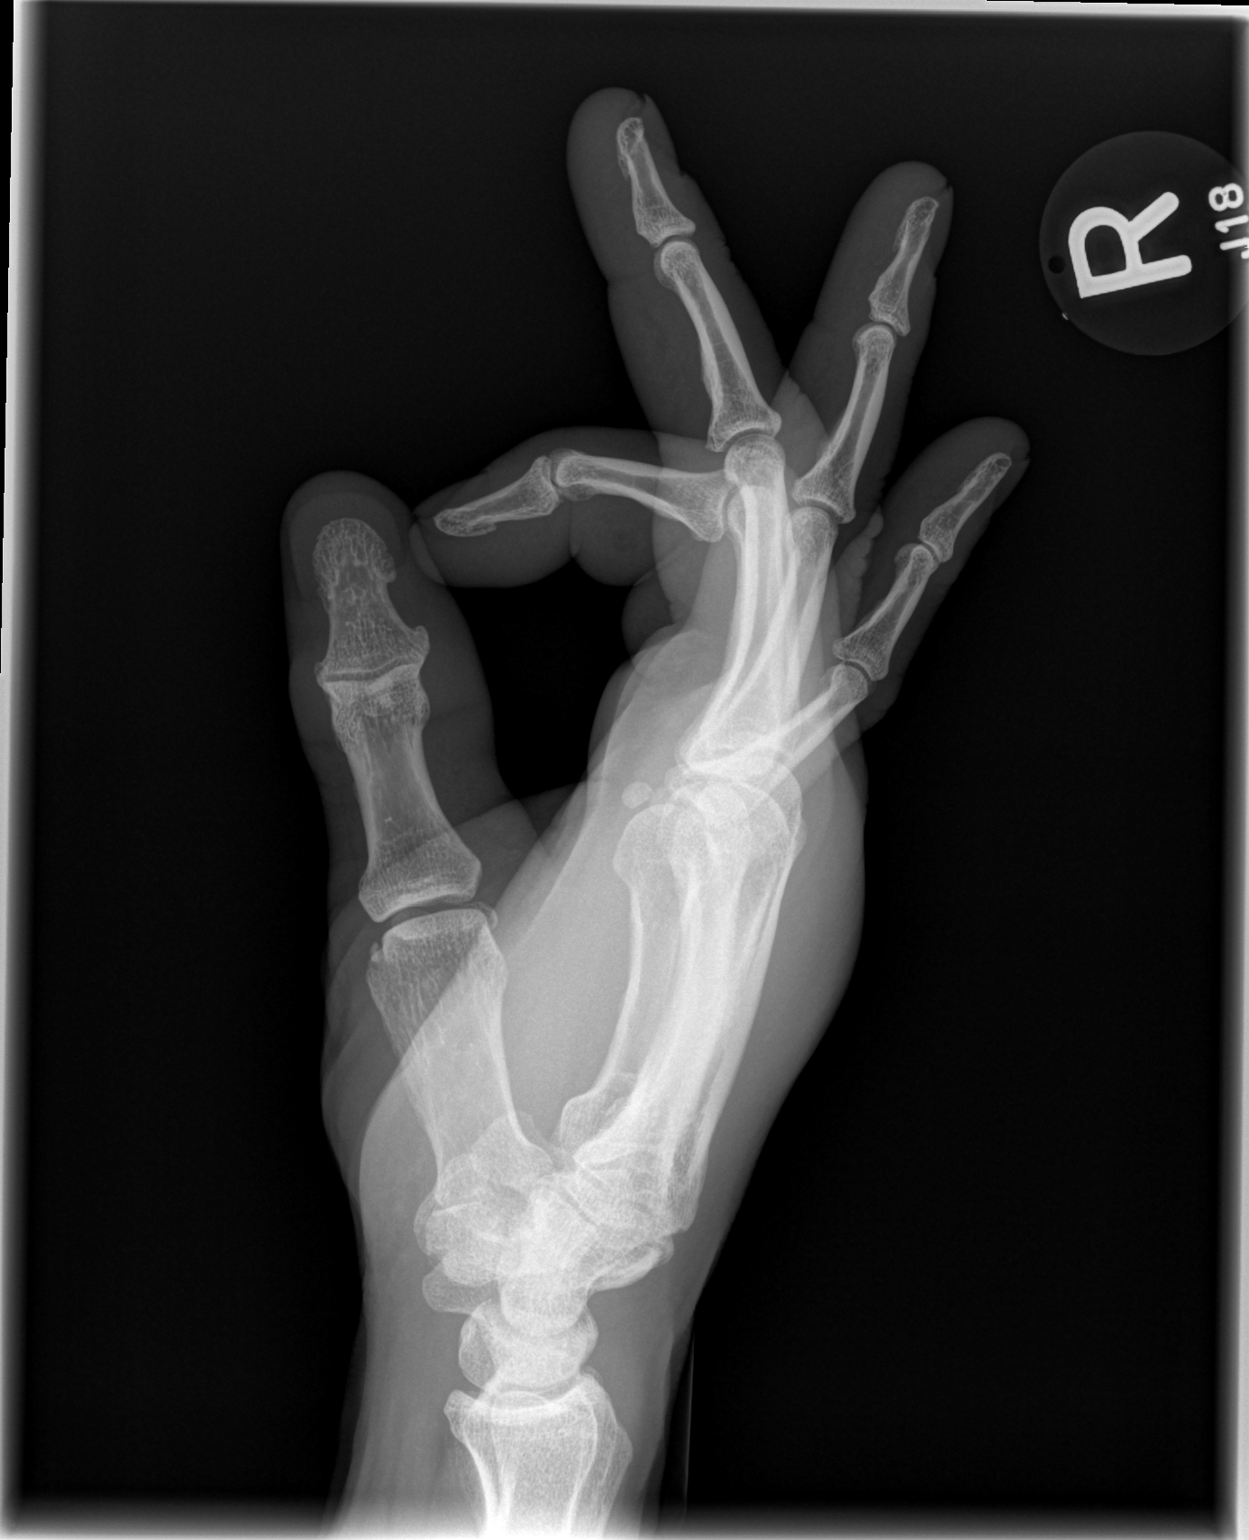

[3 of 3 positions shown; findings below may reference images not displayed]

FINDINGS: There is no evidence of fracture or dislocation.  There
is no evidence of arthropathy or other focal bony abnormality.
Dorsal soft tissue swelling centered over the second metacarpal.
No visible radiopaque foreign body. No visible air in the soft
tissues.
IMPRESSION: No acute osseous findings.  Soft tissue swelling.

## 2015-06-25 ENCOUNTER — Encounter (HOSPITAL_COMMUNITY): Payer: Self-pay | Admitting: *Deleted

## 2015-06-25 ENCOUNTER — Emergency Department (HOSPITAL_COMMUNITY)
Admission: EM | Admit: 2015-06-25 | Discharge: 2015-06-25 | Discharge status: Against Medical Advice | Payer: Self-pay | Attending: Emergency Medicine | Admitting: Emergency Medicine

## 2015-06-25 DIAGNOSIS — Y9389 Activity, other specified: Secondary | ICD-10-CM | POA: Insufficient documentation

## 2015-06-25 DIAGNOSIS — S0083XA Contusion of other part of head, initial encounter: Secondary | ICD-10-CM | POA: Insufficient documentation

## 2015-06-25 DIAGNOSIS — Z720 Tobacco use: Secondary | ICD-10-CM | POA: Insufficient documentation

## 2015-06-25 DIAGNOSIS — R200 Anesthesia of skin: Secondary | ICD-10-CM | POA: Insufficient documentation

## 2015-06-25 DIAGNOSIS — Y92410 Unspecified street and highway as the place of occurrence of the external cause: Secondary | ICD-10-CM | POA: Insufficient documentation

## 2015-06-25 DIAGNOSIS — Y999 Unspecified external cause status: Secondary | ICD-10-CM | POA: Insufficient documentation

## 2015-06-25 DIAGNOSIS — S299XXA Unspecified injury of thorax, initial encounter: Secondary | ICD-10-CM | POA: Insufficient documentation

## 2015-06-25 NOTE — ED Notes (Signed)
Pt states he is leaving to go to [facility].  Pt states he has been here 30 min and is not in a room to be seen yet.  Attempted to explain to pt about computers being down and triage process.  Informed pt that the longest wait is approx 2 hours at this time but we are working hard to get patients back to rooms as soon as they become available.  Encouraged pt to stay and he declined.
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
## 2016-10-25 ENCOUNTER — Emergency Department (HOSPITAL_COMMUNITY)
Admission: EM | Admit: 2016-10-25 | Discharge: 2016-10-25 | Disposition: A | Discharge status: Home / Self Care | Payer: Self-pay | Attending: Emergency Medicine | Admitting: Emergency Medicine

## 2016-10-25 ENCOUNTER — Encounter (HOSPITAL_COMMUNITY): Payer: Self-pay | Admitting: Emergency Medicine

## 2016-10-25 DIAGNOSIS — R55 Syncope and collapse: Secondary | ICD-10-CM | POA: Insufficient documentation

## 2016-10-25 DIAGNOSIS — F17210 Nicotine dependence, cigarettes, uncomplicated: Secondary | ICD-10-CM | POA: Insufficient documentation

## 2016-10-25 LAB — COMPREHENSIVE METABOLIC PANEL
ALK PHOS: 55 U/L (ref 38–126)
ALT: 25 U/L (ref 17–63)
ANION GAP: 7 (ref 5–15)
AST: 35 U/L (ref 15–41)
Albumin: 3.9 g/dL (ref 3.5–5.0)
BILIRUBIN TOTAL: 0.5 mg/dL (ref 0.3–1.2)
BUN: 7 mg/dL (ref 6–20)
CALCIUM: 9.3 mg/dL (ref 8.9–10.3)
CO2: 31 mmol/L (ref 22–32)
CREATININE: 0.93 mg/dL (ref 0.61–1.24)
Chloride: 101 mmol/L (ref 101–111)
GFR calc Af Amer: >60 mL/min (ref >60)
GFR calc non Af Amer: >60 mL/min (ref >60)
Glucose, Bld: 98 mg/dL (ref 65–99)
Potassium: 4.4 mmol/L (ref 3.5–5.1)
SODIUM: 139 mmol/L (ref 135–145)
TOTAL PROTEIN: 7.3 g/dL (ref 6.5–8.1)

## 2016-10-25 LAB — CBC
HCT: 47.9 % (ref 39.0–52.0)
HEMOGLOBIN: 16.1 g/dL (ref 13.0–17.0)
MCH: 30.4 pg (ref 26.0–34.0)
MCHC: 33.6 g/dL (ref 30.0–36.0)
MCV: 90.5 fL (ref 78.0–100.0)
PLATELETS: 173 10*3/uL (ref 150–400)
RBC: 5.29 MIL/uL (ref 4.22–5.81)
RDW: 12.6 % (ref 11.5–15.5)
WBC: 7.1 10*3/uL (ref 4.0–10.5)

## 2016-10-25 NOTE — ED Triage Notes (Signed)
Per EMS: Pt went out to have a cig and was found unresponsive on the grass.  Pt has a hx of drug and ETOH abuse.  Denies all today.  Pt vomited twice before coming to the hospital.  Pt initial GCS was a 10 but became more aware in the Ambulance and GCS is now a 15. Pt does not know if he hit his head. Denies Hx of seizure.   CGB is 148 118/72 84HR

## 2016-10-25 NOTE — ED Notes (Signed)
Pt verbalized understanding discharge instructions and denies any further needs or questions at this time. VS stable, ambulatory and steady gait.   

## 2016-10-25 NOTE — ED Provider Notes (Signed)
[facility]-EMERGENCY DEPT Provider Note   CSN: [id number] Arrival date & time: 10/25/16  1751     History   Chief Complaint Chief Complaint  Patient presents with  . Loss of Consciousness    HPI Jack Carroll is a 50 y.o. male.  The history is provided by the patient and medical records. No language interpreter was used.  Loss of Consciousness   This is a new problem. The current episode started less than 1 hour ago. The problem has been gradually improving. The problem is associated with normal activity. Associated symptoms include confusion (reported prior to arrival), nausea (2 days ago, now resolved) and vomiting (2 days ago, now resolved). Pertinent negatives include back pain, bladder incontinence, chest pain, congestion, diaphoresis, dizziness, fever, focal sensory loss, headaches, light-headedness, palpitations, seizures, slurred speech, vertigo and weakness. His past medical history does not include CAD (pt denies), DM (pt denies) or HTN (pt denies).    This occurred immediately prior to arrival. Patient states he was standing outside his brother's house about the Christmas dinner. Patient states he does not remember any preceding signs or symptoms. The next thing he remembers is waking up here. Denies any chest pain today. States he had nausea, vomiting, diarrhea 2 days ago but this is since resolved. States he feels essentially back to his baseline from that illness. Denies any drug use today. States he smokes approximately one pack a day. Denies any alcohol use. Denies any recent head injuries.  History reviewed. No pertinent past medical history.  There are no active problems to display for this patient.   Past Surgical History:  Procedure Laterality Date  . APPENDECTOMY    . COSMETIC SURGERY         Home Medications    Prior to Admission medications   Medication Sig Start Date End Date Taking? Authorizing Provider  azithromycin (ZITHROMAX) 250 MG tablet Take 1  tablet (250 mg total) by mouth daily. 11/22/13   Renne Crigler, PA-C  benzonatate (TESSALON) 100 MG capsule Take 1 capsule (100 mg total) by mouth every 8 (eight) hours. 11/22/13   Renne Crigler, PA-C  guaiFENesin (MUCINEX) 600 MG 12 hr tablet Take 600 mg by mouth 2 (two) times daily.    Historical Provider, MD  oxymetazoline (AFRIN) 0.05 % nasal spray Place 2 sprays into both nostrils 2 (two) times daily as needed for congestion.    Historical Provider, MD    Family History No family history on file.  Social History Social History  Substance Use Topics  . Smoking status: Current Every Day Smoker    Packs/day: 2.00    Types: Cigarettes  . Smokeless tobacco: Not on file  . Alcohol use Yes     Allergies   Codeine and Penicillins   Review of Systems Review of Systems  Constitutional: Negative for diaphoresis and fever.  HENT: Negative for congestion.   Respiratory: Negative for shortness of breath and wheezing.   Cardiovascular: Positive for syncope. Negative for chest pain and palpitations.  Gastrointestinal: Positive for nausea (2 days ago, now resolved) and vomiting (2 days ago, now resolved).  Genitourinary: Negative for bladder incontinence, dysuria and hematuria.  Musculoskeletal: Negative for back pain.  Neurological: Positive for syncope (as described in HPI). Negative for dizziness, vertigo, seizures, weakness, light-headedness and headaches.  Psychiatric/Behavioral: Positive for confusion (reported prior to arrival).  All other systems reviewed and are negative.    Physical Exam Updated Vital Signs BP 129/73   Pulse 72   Temp 97.8  F (36.6 C) (Oral)   Resp 14   Ht 6' (1.829 m)   Wt 72.6 kg   SpO2 99%   BMI 21.70 kg/m   Physical Exam  Constitutional: He is oriented to person, place, and time. No distress.  Appears older than stated age  HENT:  Head: Normocephalic and atraumatic.  Right Ear: External ear normal.  Left Ear: External ear normal.  Eyes:  Conjunctivae and EOM are normal. Pupils are equal, round, and reactive to light.  Neck: Normal range of motion. Neck supple.  Cardiovascular: Normal rate and regular rhythm.   Pulmonary/Chest: Effort normal and breath sounds normal. No respiratory distress.  Abdominal: Soft. He exhibits no distension. There is no tenderness.  Musculoskeletal: Normal range of motion. He exhibits no edema or deformity.  Neurological: He is alert and oriented to person, place, and time. He has normal strength. He displays normal reflexes. No cranial nerve deficit (CN II-XII intact) or sensory deficit. Coordination (nml finger to nose bilaterally) normal. GCS eye subscore is 4. GCS verbal subscore is 5. GCS motor subscore is 6.  Skin: Skin is warm and dry. He is not diaphoretic.  Nursing note and vitals reviewed.    ED Treatments / Results  Labs (all labs ordered are listed, but only abnormal results are displayed) Labs Reviewed  COMPREHENSIVE METABOLIC PANEL  CBC    EKG  EKG Interpretation  Date/Time:  Monday October 25 2016 17:51:49 EST Ventricular Rate:  66 PR Interval:    QRS Duration: 118 QT Interval:  465 QTC Calculation: 488 R Axis:   71 Text Interpretation:  Sinus rhythm Consider left atrial enlargement Incomplete right bundle branch block ST elev, probable normal early repol pattern Confirmed by ZAVITZ MD, JOSHUA [phone]([id number]) on 10/25/2016 7:05:50 PM       Radiology No results found.  Procedures Procedures (including critical care time)  Medications Ordered in ED Medications - No data to display   Initial Impression / Assessment and Plan / ED Course  I have reviewed the triage vital signs and the nursing notes.  Pertinent labs & imaging results that were available during my care of the patient were reviewed by me and considered in my medical decision making (see chart for details).  Clinical Course     Ration is a 50 year old male with a past medical history as documented above  who presents today with syncope episode that occurred immediately prior to arrival. On my arrival in the room, the patient is back to his baseline. He is alert and oriented. No acute neurological deficits were noted on my exam. Reviewed his EKG, there is incomplete right bundle branch block which appears to be partially present on prior EKGs. There are no obvious acute ischemic changes on my review the EKG. Patient denies any dark tarry stools, blood in the stool, chest pain, headache. His labs today included a hemoglobin and hematocrit that is within normal limits. No evidence of infection. His electrolytes are within normal limits.   No high risk features per the [facility] syncope criteria. Feel that the patient would be appropriate for discharge home. [facility] who may want to do further workup for this. Advised patient to return to the emergency department if he has recurrence of symptoms or chest pain or other worrisome signs or symptoms. Patient agreeable discharged home in good condition.  Final Clinical Impressions(s) / ED Diagnoses   Final diagnoses:  Syncope and collapse    New  Prescriptions Discharge Medication List as of 10/25/2016  8:38 PM       Madolyn FriezeVijay Jazmaine Fuelling, MD 10/25/16 [phone]    Madolyn FriezeVijay Valin Massie, MD 10/25/16 [phone]    Blane OharaJoshua Zavitz, MD 10/25/16 2337

## 2020-12-11 ENCOUNTER — Other Ambulatory Visit: Payer: Self-pay

## 2020-12-11 ENCOUNTER — Encounter (HOSPITAL_COMMUNITY): Payer: Self-pay

## 2020-12-11 ENCOUNTER — Emergency Department (HOSPITAL_COMMUNITY)
Admission: EM | Admit: 2020-12-11 | Discharge: 2020-12-11 | Disposition: A | Discharge status: Home / Self Care | Payer: Self-pay | Attending: Emergency Medicine | Admitting: Emergency Medicine

## 2020-12-11 DIAGNOSIS — W268XXA Contact with other sharp object(s), not elsewhere classified, initial encounter: Secondary | ICD-10-CM | POA: Insufficient documentation

## 2020-12-11 DIAGNOSIS — S71112A Laceration without foreign body, left thigh, initial encounter: Secondary | ICD-10-CM | POA: Insufficient documentation

## 2020-12-11 DIAGNOSIS — Z23 Encounter for immunization: Secondary | ICD-10-CM | POA: Insufficient documentation

## 2020-12-11 DIAGNOSIS — F17210 Nicotine dependence, cigarettes, uncomplicated: Secondary | ICD-10-CM | POA: Insufficient documentation

## 2020-12-11 DIAGNOSIS — S81812A Laceration without foreign body, left lower leg, initial encounter: Secondary | ICD-10-CM

## 2020-12-11 HISTORY — DX: Unspecified hearing loss, unspecified ear: H91.90

## 2020-12-11 MED ORDER — LIDOCAINE-EPINEPHRINE (PF) 2 %-1:200000 IJ SOLN
10.0000 mL | Freq: Once | INTRAMUSCULAR | Status: AC
  Administered 2020-12-11: 10 mL
  Filled 2020-12-11: qty 20

## 2020-12-11 MED ORDER — TETANUS-DIPHTH-ACELL PERTUSSIS 5-2.5-18.5 LF-MCG/0.5 IM SUSY
0.5000 mL | PREFILLED_SYRINGE | Freq: Once | INTRAMUSCULAR | Status: AC
  Administered 2020-12-11: 0.5 mL via INTRAMUSCULAR
  Filled 2020-12-11: qty 0.5

## 2020-12-11 NOTE — ED Provider Notes (Addendum)
[facility]-EMERGENCY DEPT Provider Note   CSN: [id number] Arrival date & time: 12/11/20  1220     History Chief Complaint  Patient presents with  . Laceration    Jack Carroll is a 55 y.o. male.  HPI Patient is a 55 year old male with no pertinent medical history presents the emergency department due to a laceration that occurred just prior to arrival.  Patient states that he was using new gardening shears which were on the ground.  He states that he tripped on a limb and the garden shears turned upwards and cut his left leg.  No active bleeding at this time.  Reports mild pain at the site.  Unsure of the timing of his last Tdap.  No numbness, tingling, weakness.    Past Medical History:  Diagnosis Date  . HOH (hard of hearing)     There are no problems to display for this patient.   Past Surgical History:  Procedure Laterality Date  . APPENDECTOMY    . COSMETIC SURGERY         Family History  Problem Relation Age of Onset  . Stroke Mother   . Heart attack Father     Social History   Tobacco Use  . Smoking status: Current Every Day Smoker    Packs/day: 2.00    Types: Cigarettes  . Smokeless tobacco: Never Used  Vaping Use  . Vaping Use: Never used  Substance Use Topics  . Alcohol use: Yes  . Drug use: No    Home Medications Prior to Admission medications   Medication Sig Start Date End Date Taking? Authorizing Provider  azithromycin (ZITHROMAX) 250 MG tablet Take 1 tablet (250 mg total) by mouth daily. 11/22/13   Renne Crigler, PA-C  benzonatate (TESSALON) 100 MG capsule Take 1 capsule (100 mg total) by mouth every 8 (eight) hours. 11/22/13   Renne Crigler, PA-C  guaiFENesin (MUCINEX) 600 MG 12 hr tablet Take 600 mg by mouth 2 (two) times daily.    [provider]  oxymetazoline (AFRIN) 0.05 % nasal spray Place 2 sprays into both nostrils 2 (two) times daily as needed for congestion.    [provider]     Allergies    Codeine and Penicillins  Review of Systems   Review of Systems  Constitutional: Negative for chills and fever.  Musculoskeletal: Positive for myalgias.  Skin: Positive for wound. Negative for color change.    Physical Exam Updated Vital Signs BP (!) 140/98 (BP Location: Right Arm)   Pulse 86   Temp 98.3 F (36.8 C) (Oral)   Resp 18   Ht 6' (1.829 m)   Wt 83.9 kg   SpO2 98%   BMI 25.09 kg/m   Physical Exam Vitals and nursing note reviewed.  Constitutional:      General: He is not in acute distress.    Appearance: He is well-developed.  HENT:     Head: Normocephalic and atraumatic.     Right Ear: External ear normal.     Left Ear: External ear normal.  Eyes:     General: No scleral icterus.       Right eye: No discharge.        Left eye: No discharge.     Conjunctiva/sclera: Conjunctivae normal.  Neck:     Trachea: No tracheal deviation.  Cardiovascular:     Rate and Rhythm: Normal rate.  Pulmonary:     Effort: Pulmonary effort is normal. No respiratory distress.  Breath sounds: No stridor.  Abdominal:     General: There is no distension.  Musculoskeletal:        General: No swelling or deformity.     Cervical back: Neck supple.  Skin:    General: Skin is warm and dry.     Findings: No rash.          Comments: 6 cm laceration noted to the left thigh along the medial lateral aspect, just proximal to the left knee.  Full range of motion of the left knee.  Patient is ambulatory.  No bleeding at this time.  Mild tenderness overlying the site.  Distal sensation intact.  Palpable pedal pulses.  Neurological:     Mental Status: He is alert.     Cranial Nerves: Cranial nerve deficit: no gross deficits.    ED Results / Procedures / Treatments   Labs (all labs ordered are listed, but only abnormal results are displayed) Labs Reviewed - No data to display  EKG None  Radiology No results found.  Procedures .Marland KitchenLaceration  Repair  Date/Time: 12/11/2020 2:43 PM Performed by: Placido Sou, PA-C Authorized by: Placido Sou, PA-C   Consent:    Consent obtained:  Verbal   Consent given by:  Patient   Risks discussed:  Infection, need for additional repair, pain, poor cosmetic result and poor wound healing   Alternatives discussed:  No treatment and delayed treatment Universal protocol:    Procedure explained and questions answered to patient or proxy's satisfaction: yes     Relevant documents present and verified: yes     Test results available: yes     Imaging studies available: yes     Required blood products, implants, devices, and special equipment available: yes     Site/side marked: yes     Immediately prior to procedure, a time out was called: yes     Patient identity confirmed:  Verbally with patient Anesthesia:    Anesthesia method:  Local infiltration   Local anesthetic:  Lidocaine 2% WITH epi Laceration details:    Location:  Leg   Leg location:  L upper leg   Length (cm):  6 Pre-procedure details:    Preparation:  Patient was prepped and draped in usual sterile fashion Exploration:    Hemostasis achieved with:  Epinephrine and direct pressure   Wound exploration: wound explored through full range of motion     Wound extent: no foreign bodies/material noted     Contaminated: no   Treatment:    Area cleansed with:  Saline, Shur-Clens and povidone-iodine   Amount of cleaning:  Extensive   Irrigation solution:  Sterile saline   Irrigation method:  Pressure wash   Visualized foreign bodies/material removed: no   Skin repair:    Repair method:  Sutures   Suture size:  4-0   Suture material:  Prolene   Suture technique:  Simple interrupted   Number of sutures:  7 Approximation:    Approximation:  Close Repair type:    Repair type:  Intermediate Post-procedure details:    Dressing:  Non-adherent dressing   Procedure completion:  Tolerated well, no immediate complications     Medications Ordered in ED Medications  lidocaine-EPINEPHrine (XYLOCAINE W/EPI) 2 %-1:200000 (PF) injection 10 mL (10 mLs Infiltration Given by Other 12/11/20 1420)  Tdap (BOOSTRIX) injection 0.5 mL (0.5 mLs Intramuscular Given 12/11/20 1447)   ED Course  I have reviewed the triage vital signs and the nursing notes.  Pertinent labs & imaging  results that were available during my care of the patient were reviewed by me and considered in my medical decision making (see chart for details).    MDM Rules/Calculators/A&P                          Patient is a 55 year old male who presents the emergency department due to a laceration to the left thigh.  Occurred just prior to arrival.  Unsure of the timing of his last Tdap, so this was updated in the ED.  Mild tenderness at the site but otherwise no other regions of injury.  No pain or signs of injury around the left knee.  Full range of motion of the left knee.  Distal sensation intact.  Good pedal pulses.  No red flags.  Wound was cleaned extensively.  Please see procedure note above.  Wound was closed with 7, Prolene sutures.  Patient tolerated the procedure well.  We discussed wound care in length.  Return for suture removal in 7 to 10 days.  His questions were answered and he was amicable at the time of discharge.   Final Clinical Impression(s) / ED Diagnoses Final diagnoses:  Laceration of left lower extremity, initial encounter   Rx / DC Orders ED Discharge Orders    None       Placido Sou, PA-C 12/11/20 1450    Placido Sou, PA-C 12/11/20 1454    Lorre Nick, MD 12/13/20 [phone]

## 2020-12-11 NOTE — ED Triage Notes (Signed)
Patient reports that he cut himself with large pruning shears. Patient has an approx 3 inch laceration above the left knee Bleeding controlled. paitent had a cloth tourniquet around his leg. A dry dressing applied.

## 2020-12-11 NOTE — Discharge Instructions (Addendum)
We discussed, please keep the wound clean and dry.  I have attached a significant amount of information on laceration care.  Please reference this.  If you develop any signs or symptoms of infection like we discussed, please return to the emergency department for immediate reevaluation.  Otherwise, please return to see a medical provider in 7 to 10 days for removal of your stitches.

## 2020-12-22 ENCOUNTER — Other Ambulatory Visit: Payer: Self-pay

## 2020-12-22 ENCOUNTER — Emergency Department (HOSPITAL_COMMUNITY)
Admission: EM | Admit: 2020-12-22 | Discharge: 2020-12-22 | Disposition: A | Discharge status: Home / Self Care | Payer: Self-pay | Attending: Emergency Medicine | Admitting: Emergency Medicine

## 2020-12-22 ENCOUNTER — Encounter (HOSPITAL_COMMUNITY): Payer: Self-pay

## 2020-12-22 DIAGNOSIS — Z4802 Encounter for removal of sutures: Secondary | ICD-10-CM | POA: Insufficient documentation

## 2020-12-22 NOTE — ED Provider Notes (Signed)
[facility]-EMERGENCY DEPT Provider Note   CSN: [id number] Arrival date & time: 12/22/20  1524     History Chief Complaint  Patient presents with  . Suture / Staple Removal    Jack Carroll is a 55 y.o. male presents with a chief complaint of suture removal.  Patient reports that he cut himself with gardening shears.  Patient reports that he was told to return to the emergency department to have the sutures removed in 7 to 10 days.  Patient denies any fevers, chills, nausea, vomiting, edema around wound, streaking from wound, purulent discharge, swelling.    HPI     Past Medical History:  Diagnosis Date  . HOH (hard of hearing)     There are no problems to display for this patient.   Past Surgical History:  Procedure Laterality Date  . APPENDECTOMY    . COSMETIC SURGERY         Family History  Problem Relation Age of Onset  . Stroke Mother   . Heart attack Father     Social History   Tobacco Use  . Smoking status: Current Every Day Smoker    Packs/day: 2.00    Types: Cigarettes  . Smokeless tobacco: Never Used  Vaping Use  . Vaping Use: Never used  Substance Use Topics  . Alcohol use: Yes  . Drug use: No    Home Medications Prior to Admission medications   Medication Sig Start Date End Date Taking? Authorizing Provider  azithromycin (ZITHROMAX) 250 MG tablet Take 1 tablet (250 mg total) by mouth daily. 11/22/13   Renne Crigler, PA-C  benzonatate (TESSALON) 100 MG capsule Take 1 capsule (100 mg total) by mouth every 8 (eight) hours. 11/22/13   Renne Crigler, PA-C  guaiFENesin (MUCINEX) 600 MG 12 hr tablet Take 600 mg by mouth 2 (two) times daily.    [provider]  oxymetazoline (AFRIN) 0.05 % nasal spray Place 2 sprays into both nostrils 2 (two) times daily as needed for congestion.    [provider]    Allergies    Codeine and Penicillins  Review of Systems   Review of Systems  Constitutional: Negative  for chills and fever.  Gastrointestinal: Negative for nausea and vomiting.  Musculoskeletal: Negative for arthralgias, gait problem, joint swelling and myalgias.  Skin: Positive for wound. Negative for color change, pallor and rash.    Physical Exam Updated Vital Signs BP 133/90 (BP Location: Left Arm)   Pulse 93   Temp 98.2 F (36.8 C) (Oral)   Resp 18   Ht 6\' 1"  (1.854 m)   Wt 81.6 kg   SpO2 96%   BMI 23.75 kg/m   Physical Exam Vitals and nursing note reviewed.  Constitutional:      General: He is not in acute distress.    Appearance: He is not ill-appearing, toxic-appearing or diaphoretic.  HENT:     Head: Normocephalic.  Eyes:     General: No scleral icterus.       Right eye: No discharge.        Left eye: No discharge.  Cardiovascular:     Rate and Rhythm: Normal rate.  Pulmonary:     Effort: Pulmonary effort is normal.  Musculoskeletal:     Comments: Well healed wound 6cm with 7 sutures noted to lateral left thigh, no erythema, streaking noted from wound.  No drainage or bleeding from wound.  No swelling or tenderness around healed wound  Skin:  General: Skin is warm and dry.  Neurological:     General: No focal deficit present.     Mental Status: He is alert.  Psychiatric:        Behavior: Behavior is cooperative.     ED Results / Procedures / Treatments   Labs (all labs ordered are listed, but only abnormal results are displayed) Labs Reviewed - No data to display  EKG None  Radiology No results found.  Procedures .Suture Removal  Date/Time: 12/22/2020 5:08 PM Performed by: Haskel Schroeder, PA-C Authorized by: Haskel Schroeder, PA-C   Consent:    Consent obtained:  Verbal   Consent given by:  Patient   Risks discussed:  Bleeding, pain and wound separation   Alternatives discussed:  No treatment Universal protocol:    Patient identity confirmed:  Verbally with patient and arm band Location:    Location:  Lower extremity   Lower  extremity location:  Leg   Leg location:  L upper leg Procedure details:    Wound appearance:  No signs of infection, good wound healing and clean   Number of sutures removed:  7 Post-procedure details:    Post-removal:  No dressing applied   Procedure completion:  Tolerated well, no immediate complications    Medications Ordered in ED Medications - No data to display  ED Course  I have reviewed the triage vital signs and the nursing notes.  Pertinent labs & imaging results that were available during my care of the patient were reviewed by me and considered in my medical decision making (see chart for details).    MDM Rules/Calculators/A&P                          Alert 55 year old male no acute distress, nontoxic appearing.  Patient presents with chief complaint of suture removal.  No fevers, chills, nausea, vomiting, erythema around wound, streaking from wound, drainage from wound, swelling to the.  6 centimeter wound to left lateral thigh, wound is dry, intact, well-healed.  No drainage or bleeding noted.  No erythema surrounding wound or streaking.  No swelling or tenderness around wound.  7 sutures were removed out complication.   Final Clinical Impression(s) / ED Diagnoses Final diagnoses:  Visit for suture removal    Rx / DC Orders ED Discharge Orders    None       Haskel Schroeder, PA-C 12/22/20 1719    Alvira Monday, MD 12/24/20 [phone]

## 2020-12-22 NOTE — ED Triage Notes (Signed)
Pt arrived via walk in, here for suture removal. States no issues with such.

## 2020-12-22 NOTE — Discharge Instructions (Signed)
You came to the hospital to have your sutures removed.  Your wound no signs of infection or complication.  7 stitches were removed without complication.    Get help right away if: You have a fever. You have redness that is spreading from your wound.

## 2020-12-22 NOTE — ED Notes (Signed)
An After Visit Summary was printed and given to the patient. Discharge instructions given and no further questions at this time.  

## 2023-02-13 ENCOUNTER — Emergency Department (HOSPITAL_COMMUNITY): Payer: 59

## 2023-02-13 ENCOUNTER — Emergency Department (HOSPITAL_COMMUNITY)
Admission: EM | Admit: 2023-02-13 | Discharge: 2023-02-13 | Disposition: A | Discharge status: Home / Self Care | Payer: 59 | Attending: Emergency Medicine | Admitting: Emergency Medicine

## 2023-02-13 ENCOUNTER — Encounter (HOSPITAL_COMMUNITY): Payer: Self-pay

## 2023-02-13 DIAGNOSIS — R079 Chest pain, unspecified: Secondary | ICD-10-CM | POA: Insufficient documentation

## 2023-02-13 DIAGNOSIS — F17210 Nicotine dependence, cigarettes, uncomplicated: Secondary | ICD-10-CM | POA: Insufficient documentation

## 2023-02-13 DIAGNOSIS — S9031XA Contusion of right foot, initial encounter: Secondary | ICD-10-CM | POA: Diagnosis not present

## 2023-02-13 DIAGNOSIS — W208XXA Other cause of strike by thrown, projected or falling object, initial encounter: Secondary | ICD-10-CM | POA: Insufficient documentation

## 2023-02-13 DIAGNOSIS — F1290 Cannabis use, unspecified, uncomplicated: Secondary | ICD-10-CM | POA: Diagnosis not present

## 2023-02-13 DIAGNOSIS — S99921A Unspecified injury of right foot, initial encounter: Secondary | ICD-10-CM | POA: Diagnosis not present

## 2023-02-13 DIAGNOSIS — R600 Localized edema: Secondary | ICD-10-CM | POA: Diagnosis not present

## 2023-02-13 DIAGNOSIS — R0789 Other chest pain: Secondary | ICD-10-CM | POA: Diagnosis not present

## 2023-02-13 DIAGNOSIS — M7989 Other specified soft tissue disorders: Secondary | ICD-10-CM | POA: Diagnosis not present

## 2023-02-13 DIAGNOSIS — M79671 Pain in right foot: Secondary | ICD-10-CM | POA: Diagnosis not present

## 2023-02-13 LAB — CBC WITH DIFFERENTIAL/PLATELET
Abs Immature Granulocytes: 0.02 10*3/uL (ref 0.00–0.07)
Basophils Absolute: 0.1 10*3/uL (ref 0.0–0.1)
Basophils Relative: 1 %
Eosinophils Absolute: 0.2 10*3/uL (ref 0.0–0.5)
Eosinophils Relative: 3 %
HCT: 43.2 % (ref 39.0–52.0)
Hemoglobin: 14.4 g/dL (ref 13.0–17.0)
Immature Granulocytes: 0 %
Lymphocytes Relative: 25 %
Lymphs Abs: 1.9 10*3/uL (ref 0.7–4.0)
MCH: 29.1 pg (ref 26.0–34.0)
MCHC: 33.3 g/dL (ref 30.0–36.0)
MCV: 87.4 fL (ref 80.0–100.0)
Monocytes Absolute: 0.6 10*3/uL (ref 0.1–1.0)
Monocytes Relative: 8 %
Neutro Abs: 4.9 10*3/uL (ref 1.7–7.7)
Neutrophils Relative %: 63 %
Platelets: 282 10*3/uL (ref 150–400)
RBC: 4.94 MIL/uL (ref 4.22–5.81)
RDW: 12.3 % (ref 11.5–15.5)
WBC: 7.7 10*3/uL (ref 4.0–10.5)
nRBC: 0 % (ref 0.0–0.2)

## 2023-02-13 LAB — COMPREHENSIVE METABOLIC PANEL
ALT: 21 U/L (ref 0–44)
AST: 32 U/L (ref 15–41)
Albumin: 3.7 g/dL (ref 3.5–5.0)
Alkaline Phosphatase: 64 U/L (ref 38–126)
Anion gap: 7 (ref 5–15)
BUN: 15 mg/dL (ref 6–20)
CO2: 28 mmol/L (ref 22–32)
Calcium: 8.5 mg/dL — ABNORMAL LOW (ref 8.9–10.3)
Chloride: 99 mmol/L (ref 98–111)
Creatinine, Ser: 0.95 mg/dL (ref 0.61–1.24)
GFR, Estimated: >60 mL/min (ref >60)
Glucose, Bld: 89 mg/dL (ref 70–99)
Potassium: 3.7 mmol/L (ref 3.5–5.1)
Sodium: 134 mmol/L — ABNORMAL LOW (ref 135–145)
Total Bilirubin: 0.8 mg/dL (ref 0.3–1.2)
Total Protein: 7.3 g/dL (ref 6.5–8.1)

## 2023-02-13 LAB — RAPID URINE DRUG SCREEN, HOSP PERFORMED
Amphetamines: NOT DETECTED
Barbiturates: NOT DETECTED
Benzodiazepines: NOT DETECTED
Cocaine: NOT DETECTED
Opiates: NOT DETECTED
Tetrahydrocannabinol: POSITIVE — AB

## 2023-02-13 LAB — TROPONIN I (HIGH SENSITIVITY)
Troponin I (High Sensitivity): 6 ng/L (ref <18)
Troponin I (High Sensitivity): 7 ng/L (ref <18)

## 2023-02-13 MED ORDER — ASPIRIN 325 MG PO TABS
325.0000 mg | Freq: Once | ORAL | Status: AC
Start: 2023-02-13 — End: 2023-02-13
  Administered 2023-02-13: 325 mg via ORAL
  Filled 2023-02-13: qty 1

## 2023-02-13 MED ORDER — SODIUM CHLORIDE 0.9 % IV BOLUS
1000.0000 mL | Freq: Once | INTRAVENOUS | Status: AC
Start: 2023-02-13 — End: 2023-02-13
  Administered 2023-02-13: 1000 mL via INTRAVENOUS

## 2023-02-13 NOTE — ED Triage Notes (Signed)
Pt c/o CP since 0400, worsening the last hour or so. Pt states he is not sure if it is lungs or heart. Pt is a smoker. 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 Pt has had non-productive cough as well

## 2023-02-13 NOTE — Discharge Instructions (Signed)
Take Tylenol or Motrin for pain.  As we discussed, you should quit smoking and follow-up with primary care doctor  Return to ER if you have worse chest pain or shortness of breath.

## 2023-02-13 NOTE — ED Provider Notes (Signed)
[place] EMERGENCY DEPARTMENT AT [facility] Provider Note   CSN: [id number] Arrival date & time: 02/13/23  1419     History  Chief Complaint  Patient presents with   Chest Pain    Shanard A Kuczmarski is a 57 y.o. male here presenting with chest pain.  Patient states that he has substernal chest pain started around 4 AM.  He was concern for possible heart attack.  He states that he has no history of CAD or diabetes but has not seen doctors for decades.  He does admit to previous crack cocaine use but has not used it for the last 8 years.  Patient states that he has multiple family members who had MIs.  He also states about a week ago he was fixing the car and the car landed on his right foot.  He had some swelling afterwards but was able to ambulate on it.  The history is provided by the patient.       Home Medications Prior to Admission medications   Not on File      Allergies    Codeine and Penicillins    Review of Systems   Review of Systems  Cardiovascular:  Positive for chest pain.  All other systems reviewed and are negative.   Physical Exam Updated Vital Signs BP 128/84 (BP Location: Left Arm)   Pulse 96   Temp 99.3 F (37.4 C) (Oral)   Resp 20   Ht 6\' 1"  (1.854 m)   Wt 77.1 kg   SpO2 100%   BMI 22.43 kg/m  Physical Exam Vitals and nursing note reviewed.  Constitutional:      Comments: Older than stated age  HENT:     Head: Normocephalic.  Eyes:     Extraocular Movements: Extraocular movements intact.     Pupils: Pupils are equal, round, and reactive to light.  Cardiovascular:     Rate and Rhythm: Normal rate and regular rhythm.     Heart sounds: Normal heart sounds.  Pulmonary:     Effort: Pulmonary effort is normal.     Breath sounds: Normal breath sounds.  Abdominal:     General: Bowel sounds are normal.     Palpations: Abdomen is soft.  Musculoskeletal:        General: Normal range of motion.     Cervical back: Normal range of  motion and neck supple.     Comments: Some right foot swelling and mild diffuse tenderness.  No deformity.  Skin:    General: Skin is warm.     Capillary Refill: Capillary refill takes less than 2 seconds.  Neurological:     General: No focal deficit present.     Mental Status: He is oriented to person, place, and time.  Psychiatric:        Mood and Affect: Mood normal.        Behavior: Behavior normal.     ED Results / Procedures / Treatments   Labs (all labs ordered are listed, but only abnormal results are displayed) Labs Reviewed  CBC WITH DIFFERENTIAL/PLATELET  COMPREHENSIVE METABOLIC PANEL  RAPID URINE DRUG SCREEN, HOSP PERFORMED  TROPONIN I (HIGH SENSITIVITY)    EKG None  Radiology DG Chest 2 View  Result Date: 02/13/2023 CLINICAL DATA:  Intermittent chest pain since this morning. EXAM: CHEST - 2 VIEW COMPARISON:  None Available. FINDINGS: Heart size and mediastinal contours are within normal limits. Coarse lung markings are seen bilaterally. No confluent opacity to suggest  a developing pneumonia. No pleural effusion or pneumothorax is seen. Osseous structures about the chest are unremarkable. IMPRESSION: 1. No active cardiopulmonary disease. No evidence of pneumonia or pulmonary edema. 2. Probable chronic interstitial lung disease. Electronically Signed   By: Bary Richard M.D.   On: 02/13/2023 15:00    Procedures Procedures    Medications Ordered in ED Medications  aspirin tablet 325 mg (325 mg Oral Given 02/13/23 1556)  sodium chloride 0.9 % bolus 1,000 mL (1,000 mLs Intravenous New Bag/Given 02/13/23 1554)    ED Course/ Medical Decision Making/ A&P                             Medical Decision Making Jaydien A Stafford is a 57 y.o. male here presenting with chest pain.  Patient had substernal chest pain since this morning.  Patient previously used cocaine but states that he does not use it anymore.  Concern for possible ACS.  Plan to get CBC and CMP and troponin  x 2.  He also injured his right foot a week ago so we will get right foot x-rays as well.  7:36 PM Reviewed patient's labs and independently interpreted chest x-ray.  Troponin negative x 2.  Patient's UDS positive for marijuana.  Chest x-ray clear and right foot x-ray showed no fracture.  At this point he is stable for discharge.  I told him to find a primary care doctor for follow-up.  Problems Addressed: Chest pain, unspecified type: acute illness or injury Contusion of right foot, initial encounter: acute illness or injury  Amount and/or Complexity of Data Reviewed Labs: ordered. Decision-making details documented in ED Course. Radiology: ordered and independent interpretation performed. Decision-making details documented in ED Course.  Risk OTC drugs.    Final Clinical Impression(s) / ED Diagnoses Final diagnoses:  None    Rx / DC Orders ED Discharge Orders     None         Charlynne Pander, MD 02/13/23 [phone]

## 2023-12-08 ENCOUNTER — Encounter (HOSPITAL_COMMUNITY): Payer: Self-pay

## 2023-12-08 ENCOUNTER — Emergency Department (HOSPITAL_COMMUNITY): Payer: Medicaid Other

## 2023-12-08 ENCOUNTER — Other Ambulatory Visit: Payer: Self-pay

## 2023-12-08 ENCOUNTER — Emergency Department (HOSPITAL_COMMUNITY)
Admission: EM | Admit: 2023-12-08 | Discharge: 2023-12-08 | Disposition: A | Discharge status: Home / Self Care | Payer: Medicaid Other | Attending: Emergency Medicine | Admitting: Emergency Medicine

## 2023-12-08 DIAGNOSIS — Z20822 Contact with and (suspected) exposure to covid-19: Secondary | ICD-10-CM | POA: Diagnosis not present

## 2023-12-08 DIAGNOSIS — R059 Cough, unspecified: Secondary | ICD-10-CM | POA: Diagnosis present

## 2023-12-08 DIAGNOSIS — J101 Influenza due to other identified influenza virus with other respiratory manifestations: Secondary | ICD-10-CM | POA: Diagnosis not present

## 2023-12-08 LAB — CBC
HCT: 43.8 % (ref 39.0–52.0)
Hemoglobin: 14.9 g/dL (ref 13.0–17.0)
MCH: 30 pg (ref 26.0–34.0)
MCHC: 34 g/dL (ref 30.0–36.0)
MCV: 88.3 fL (ref 80.0–100.0)
Platelets: 155 10*3/uL (ref 150–400)
RBC: 4.96 MIL/uL (ref 4.22–5.81)
RDW: 12.5 % (ref 11.5–15.5)
WBC: 5.7 10*3/uL (ref 4.0–10.5)
nRBC: 0 % (ref 0.0–0.2)

## 2023-12-08 LAB — BASIC METABOLIC PANEL
Anion gap: 13 (ref 5–15)
BUN: 11 mg/dL (ref 6–20)
CO2: 23 mmol/L (ref 22–32)
Calcium: 8.6 mg/dL — ABNORMAL LOW (ref 8.9–10.3)
Chloride: 91 mmol/L — ABNORMAL LOW (ref 98–111)
Creatinine, Ser: 1.13 mg/dL (ref 0.61–1.24)
GFR, Estimated: >60 mL/min (ref >60)
Glucose, Bld: 90 mg/dL (ref 70–99)
Potassium: 4.1 mmol/L (ref 3.5–5.1)
Sodium: 127 mmol/L — ABNORMAL LOW (ref 135–145)

## 2023-12-08 LAB — RESP PANEL BY RT-PCR (RSV, FLU A&B, COVID)  RVPGX2
Influenza A by PCR: POSITIVE — AB
Influenza B by PCR: NEGATIVE
Resp Syncytial Virus by PCR: NEGATIVE
SARS Coronavirus 2 by RT PCR: NEGATIVE

## 2023-12-08 LAB — TROPONIN I (HIGH SENSITIVITY): Troponin I (High Sensitivity): 9 ng/L (ref <18)

## 2023-12-08 MED ORDER — ALBUTEROL SULFATE HFA 108 (90 BASE) MCG/ACT IN AERS
2.0000 | INHALATION_SPRAY | RESPIRATORY_TRACT | Status: DC | PRN
  Filled 2023-12-08: qty 6.7

## 2023-12-08 NOTE — ED Provider Notes (Signed)
 [place] EMERGENCY DEPARTMENT AT [facility] Provider Note   CSN: [id number] Arrival date & time: 12/08/23  9347     History  Chief Complaint  Patient presents with   Chest Pain   URI    Jack Carroll is a 58 y.o. male.  Patient is a 58 year old male with a history of tobacco use who is presenting today with 2 to 3 days of general malaise, myalgias, cough, chest pain, occasional shortness of breath with wheezing.  He denies any abdominal pain, nausea vomiting or diarrhea.  Also has had fever and chills.  He has tried Mucinex and Goody's powder at home with some improvement.  The history is provided by the patient.  Chest Pain URI      Home Medications Prior to Admission medications   Medication Sig Start Date End Date Taking? Authorizing Provider  azithromycin  (ZITHROMAX ) 250 MG tablet Take 1 tablet (250 mg total) by mouth daily. 11/22/13   Geiple, Joshua, PA-C  benzonatate  (TESSALON ) 100 MG capsule Take 1 capsule (100 mg total) by mouth every 8 (eight) hours. 11/22/13   Desiderio Chew, PA-C  guaiFENesin (MUCINEX) 600 MG 12 hr tablet Take 600 mg by mouth 2 (two) times daily.    [provider]  oxymetazoline (AFRIN) 0.05 % nasal spray Place 2 sprays into both nostrils 2 (two) times daily as needed for congestion.    [provider]      Allergies    Codeine and Penicillins    Review of Systems   Review of Systems  Cardiovascular:  Positive for chest pain.    Physical Exam Updated Vital Signs Temp 98.5 F (36.9 C) (Oral)  Physical Exam Vitals and nursing note reviewed.  Constitutional:      General: He is not in acute distress.    Appearance: He is well-developed.  HENT:     Head: Normocephalic and atraumatic.     Right Ear: Ear canal normal. A middle ear effusion is present.     Left Ear: There is impacted cerumen.     Nose:     Right Turbinates: Enlarged.     Left Turbinates: Enlarged.     Mouth/Throat:     Pharynx:  Oropharynx is clear. Uvula midline.  Eyes:     Conjunctiva/sclera: Conjunctivae normal.     Pupils: Pupils are equal, round, and reactive to light.  Cardiovascular:     Rate and Rhythm: Normal rate and regular rhythm.     Heart sounds: No murmur heard. Pulmonary:     Effort: Pulmonary effort is normal. No respiratory distress.     Breath sounds: Normal breath sounds. No wheezing or rales.  Musculoskeletal:        General: No tenderness. Normal range of motion.     Cervical back: Normal range of motion and neck supple.  Skin:    General: Skin is warm and dry.     Findings: No erythema or rash.  Neurological:     Mental Status: He is alert and oriented to person, place, and time.  Psychiatric:        Behavior: Behavior normal.     ED Results / Procedures / Treatments   Labs (all labs ordered are listed, but only abnormal results are displayed) Labs Reviewed  RESP PANEL BY RT-PCR (RSV, FLU A&B, COVID)  RVPGX2 - Abnormal; Notable for the following components:      Result Value   Influenza A by PCR POSITIVE (*)    All  other components within normal limits  BASIC METABOLIC PANEL - Abnormal; Notable for the following components:   Sodium 127 (*)    Chloride 91 (*)    Calcium 8.6 (*)    All other components within normal limits  CBC  TROPONIN I (HIGH SENSITIVITY)  TROPONIN I (HIGH SENSITIVITY)    EKG EKG Interpretation Date/Time:  Thursday December 08 2023 07:00:59 EST Ventricular Rate:  74 PR Interval:  150 QRS Duration:  107 QT Interval:  413 QTC Calculation: 459 R Axis:   70  Text Interpretation: Sinus rhythm Right atrial enlargement Consider right ventricular hypertrophy ST elevation, consider inferior injury Baseline wander in lead(s) III aVL V1 V2 V3 V4 V5 V6 No significant change since last tracing Confirmed by Doretha Folks ([id number]) on 12/08/2023 9:12:47 AM  Radiology DG Chest 2 View Result Date: 12/08/2023 CLINICAL DATA:  Cold sweats and coughing with chest  pain. EXAM: CHEST - 2 VIEW COMPARISON:  PA and lateral 11/22/2013. FINDINGS: The heart size and mediastinal contours are within normal limits. There is central bronchial thickening consistent with bronchitis, likely chronic. Increased perihilar interstitial lung markings are similar to the prior study and may suggest chronic bronchiolitis or viral lower respiratory infection. No focal consolidation is seen. The sulci are sharp. No acute osseous findings. IMPRESSION: 1. Central bronchial thickening consistent with bronchitis, likely chronic. 2. Increased perihilar interstitial lung markings are similar to the prior study and may suggest chronic bronchiolitis or viral lower respiratory infection versus changes of chronic reactive airways disease. 3. No focal consolidation is seen. Electronically Signed   By: Francis Quam M.D.   On: 12/08/2023 07:58    Procedures Procedures    Medications Ordered in ED Medications  albuterol  (VENTOLIN  HFA) 108 (90 Base) MCG/ACT inhaler 2 puff (has no administration in time range)    ED Course/ Medical Decision Making/ A&P                                 Medical Decision Making Amount and/or Complexity of Data Reviewed Labs: ordered. Decision-making details documented in ED Course. Radiology: ordered and independent interpretation performed. Decision-making details documented in ED Course. ECG/medicine tests: ordered and independent interpretation performed. Decision-making details documented in ED Course.  Risk Prescription drug management.   Pt with symptoms consistent with influenza.  Normal exam and is afebrile.  No signs of breathing difficulty  No signs of strep pharyngitis, otitis or abnormal abdominal findings.   I have independently visualized and interpreted pt's images today. CXR without acute findings.  Radiology reported most likely chronic bronchitis or reactive airways.  I independently interpreted patient's labs and EKG.  EKG without acute  findings today, BMP with mild hyponatremia today of 127 but normal renal function and potassium, could be related to dehydration, CBC within normal limits, troponin is negative, patient is flu A+.  Will continue antipyretica and rest and fluids and return for any further problems.         Final Clinical Impression(s) / ED Diagnoses Final diagnoses:  Influenza A    Rx / DC Orders ED Discharge Orders     None         Doretha Folks, MD 12/08/23 [phone]

## 2023-12-08 NOTE — Discharge Instructions (Signed)
 You can continue to take Tylenol , ibuprofen as needed for the body aches and pains.  Make sure you are getting plenty of rest and drinking plenty of fluids.  Use the inhaler as needed and avoid smoking cigarettes.

## 2023-12-08 NOTE — ED Triage Notes (Signed)
 Pt presents via POV c/o "cold sweats". Cough, and chest pain x2 days. Reports symptoms unrelieved with treatment at home. Ambulatory to triage. A&O x4.

## 2024-06-17 ENCOUNTER — Encounter (HOSPITAL_COMMUNITY): Payer: Self-pay

## 2024-06-17 ENCOUNTER — Emergency Department (HOSPITAL_COMMUNITY)
Admission: EM | Admit: 2024-06-17 | Discharge: 2024-06-17 | Disposition: A | Discharge status: Home / Self Care | Attending: Emergency Medicine | Admitting: Emergency Medicine

## 2024-06-17 ENCOUNTER — Emergency Department (HOSPITAL_COMMUNITY)

## 2024-06-17 ENCOUNTER — Other Ambulatory Visit: Payer: Self-pay

## 2024-06-17 DIAGNOSIS — M5442 Lumbago with sciatica, left side: Secondary | ICD-10-CM | POA: Diagnosis not present

## 2024-06-17 DIAGNOSIS — R0789 Other chest pain: Secondary | ICD-10-CM | POA: Insufficient documentation

## 2024-06-17 DIAGNOSIS — F17210 Nicotine dependence, cigarettes, uncomplicated: Secondary | ICD-10-CM | POA: Insufficient documentation

## 2024-06-17 DIAGNOSIS — M545 Low back pain, unspecified: Secondary | ICD-10-CM | POA: Diagnosis present

## 2024-06-17 LAB — CBC WITH DIFFERENTIAL/PLATELET
Abs Immature Granulocytes: 0.02 K/uL (ref 0.00–0.07)
Basophils Absolute: 0.1 K/uL (ref 0.0–0.1)
Basophils Relative: 1 %
Eosinophils Absolute: 0.4 K/uL (ref 0.0–0.5)
Eosinophils Relative: 6 %
HCT: 44.4 % (ref 39.0–52.0)
Hemoglobin: 14.5 g/dL (ref 13.0–17.0)
Immature Granulocytes: 0 %
Lymphocytes Relative: 50 %
Lymphs Abs: 3.2 K/uL (ref 0.7–4.0)
MCH: 28.5 pg (ref 26.0–34.0)
MCHC: 32.7 g/dL (ref 30.0–36.0)
MCV: 87.4 fL (ref 80.0–100.0)
Monocytes Absolute: 0.4 K/uL (ref 0.1–1.0)
Monocytes Relative: 6 %
Neutro Abs: 2.4 K/uL (ref 1.7–7.7)
Neutrophils Relative %: 37 %
Platelets: 231 K/uL (ref 150–400)
RBC: 5.08 MIL/uL (ref 4.22–5.81)
RDW: 12.3 % (ref 11.5–15.5)
WBC: 6.5 K/uL (ref 4.0–10.5)
nRBC: 0 % (ref 0.0–0.2)

## 2024-06-17 LAB — BASIC METABOLIC PANEL WITH GFR
Anion gap: 8 (ref 5–15)
BUN: 8 mg/dL (ref 6–20)
CO2: 25 mmol/L (ref 22–32)
Calcium: 8.6 mg/dL — ABNORMAL LOW (ref 8.9–10.3)
Chloride: 103 mmol/L (ref 98–111)
Creatinine, Ser: 1.07 mg/dL (ref 0.61–1.24)
GFR, Estimated: >60 mL/min (ref >60)
Glucose, Bld: 98 mg/dL (ref 70–99)
Potassium: 4 mmol/L (ref 3.5–5.1)
Sodium: 136 mmol/L (ref 135–145)

## 2024-06-17 LAB — TROPONIN I (HIGH SENSITIVITY)
Troponin I (High Sensitivity): 5 ng/L (ref <18)
Troponin I (High Sensitivity): 5 ng/L (ref <18)

## 2024-06-17 MED ORDER — METHOCARBAMOL 500 MG PO TABS
500.0000 mg | ORAL_TABLET | Freq: Once | ORAL | Status: AC
  Administered 2024-06-17: 500 mg via ORAL
  Filled 2024-06-17: qty 1

## 2024-06-17 MED ORDER — OXYCODONE HCL 5 MG PO TABS
5.0000 mg | ORAL_TABLET | Freq: Once | ORAL | Status: AC
  Administered 2024-06-17: 5 mg via ORAL
  Filled 2024-06-17: qty 1

## 2024-06-17 MED ORDER — METHOCARBAMOL 500 MG PO TABS: 500.0000 mg | ORAL_TABLET | Freq: Two times a day (BID) | ORAL | 0 refills | Status: AC

## 2024-06-17 MED ORDER — KETOROLAC TROMETHAMINE 15 MG/ML IJ SOLN
15.0000 mg | Freq: Once | INTRAMUSCULAR | Status: AC
  Administered 2024-06-17: 15 mg via INTRAVENOUS
  Filled 2024-06-17: qty 1

## 2024-06-17 MED ORDER — IOHEXOL 350 MG/ML SOLN
100.0000 mL | Freq: Once | INTRAVENOUS | Status: AC | PRN
  Administered 2024-06-17: 75 mL via INTRAVENOUS

## 2024-06-17 NOTE — Discharge Instructions (Addendum)
 Thank you for coming to [facility] Emergency Department. You were seen for back and ches tpain. We did an exam, labs, and imaging, and these showed left-sided sciatica. You can alterate tylenol  and ibuprofen for back pain. Continue to utilize the lidocaine  patches. Heat, ice, massage, and stretching can also help. You can use robaxin  500 mg twice per day for muscle spasms. Please establish with a primary care physician. Please follow up with your primary care provider within 1 week.   For your chest pain, please see a cardiologist.  You can follow-up within 1 to 2 weeks.  A referral has been placed and they should call you to make an appointment.  Do not hesitate to return to the ED or call 911 if you experience: -Worsening symptoms -Worsening chest pain, shortness of breath - Fever (100.4 F or 38 C) or chills at home that do not respond to over the counter medications - Weakness, numbness, or tingling in your extremities - Difficulty emptying bladder / urinary incontinence - Fecal incontinence - Uncontrolled nausea/vomiting with inability to keep down liquids - Feeling as though you are going to pass out or passing out - Anything else that concerns you

## 2024-06-17 NOTE — ED Triage Notes (Signed)
 Patient reports woke up this morning around 0600 with left sided chest pain.  Reports it went away around 1030 but started having severe pain to the left lateral leg from hip to ankle.  Reports its pain he has never felt before.  Patient had long hx of drug abuse but clean now and quit drinking 9 years ago but extensive family hx of cardiac problems and strokes.

## 2024-06-17 NOTE — ED Notes (Signed)
 ABI calculation 1.19

## 2024-06-17 NOTE — ED Provider Notes (Signed)
 [place] EMERGENCY DEPARTMENT AT [facility] Provider Note   CSN: [id number] Arrival date & time: 06/17/24  1605     History {Add pertinent medical, surgical, social history, OB history to HPI:1} Chief Complaint  Patient presents with   Chest Pain   Leg Pain    Jack Carroll is a 58 y.o. male with PMH as listed below who presents with ***.  reports woke up this morning around 0600 with left sided chest pain. Reports it went away around 1030 but started having severe pain to the left lateral leg from hip to ankle. Reports its pain he has never felt before. Patient had long hx of drug abuse but clean now and quit drinking 9 years ago but extensive family hx of cardiac problems and strokes.   Past Medical History:  Diagnosis Date   HOH (hard of hearing)        Home Medications Prior to Admission medications   Medication Sig Start Date End Date Taking? Authorizing Provider  azithromycin  (ZITHROMAX ) 250 MG tablet Take 1 tablet (250 mg total) by mouth daily. 11/22/13   Geiple, Joshua, PA-C  benzonatate  (TESSALON ) 100 MG capsule Take 1 capsule (100 mg total) by mouth every 8 (eight) hours. 11/22/13   Desiderio Chew, PA-C  guaiFENesin (MUCINEX) 600 MG 12 hr tablet Take 600 mg by mouth 2 (two) times daily.    [provider]  oxymetazoline (AFRIN) 0.05 % nasal spray Place 2 sprays into both nostrils 2 (two) times daily as needed for congestion.    [provider]      Allergies    Codeine, Codeine, Penicillins, and Penicillins    Review of Systems   Review of Systems A 10 point review of systems was performed and is negative unless otherwise reported in HPI.  Physical Exam Updated Vital Signs BP 115/71   Pulse (!) 50   Temp 98.6 F (37 C) (Oral)   Resp 15   Ht 6' 1 (1.854 m)   Wt 77.1 kg   SpO2 100%   BMI 22.43 kg/m  Physical Exam General: Normal appearing {Desc; male/male:11659}, lying in bed.  HEENT: PERRLA, Sclera anicteric, MMM,  trachea midline.  Cardiology: RRR, no murmurs/rubs/gallops. BL radial and DP pulses equal bilaterally.  Resp: Normal respiratory rate and effort. CTAB, no wheezes, rhonchi, crackles.  Abd: Soft, non-tender, non-distended. No rebound tenderness or guarding.  GU: Deferred. MSK: No peripheral edema or signs of trauma. Extremities without deformity or TTP. No cyanosis or clubbing. Skin: warm, dry. No rashes or lesions. Back: No CVA tenderness Neuro: A&Ox4, CNs II-XII grossly intact. MAEs. Sensation grossly intact.  Psych: Normal mood and affect.   ED Results / Procedures / Treatments   Labs (all labs ordered are listed, but only abnormal results are displayed) Labs Reviewed  BASIC METABOLIC PANEL WITH GFR - Abnormal; Notable for the following components:      Result Value   Calcium 8.6 (*)    All other components within normal limits  CBC WITH DIFFERENTIAL/PLATELET  TROPONIN I (HIGH SENSITIVITY)  TROPONIN I (HIGH SENSITIVITY)    EKG EKG Interpretation Date/Time:  Sunday June 17 2024 16:18:22 EDT Ventricular Rate:  86 PR Interval:  145 QRS Duration:  100 QT Interval:  354 QTC Calculation: 424 R Axis:   59  Text Interpretation: Sinus rhythm RSR' in V1 or V2, right VCD or RVH Confirmed by Franklyn Gills [phone]) on 06/17/2024 5:28:20 PM  Radiology CT Angio Chest Aorta W and/or Wo Contrast  Result Date: 06/17/2024 CLINICAL DATA:  Left-sided chest pain EXAM: CT ANGIOGRAPHY CHEST WITH CONTRAST TECHNIQUE: Multidetector CT imaging of the chest was performed using the standard protocol during bolus administration of intravenous contrast. Multiplanar CT image reconstructions and MIPs were obtained to evaluate the vascular anatomy. RADIATION DOSE REDUCTION: This exam was performed according to the departmental dose-optimization program which includes automated exposure control, adjustment of the mA and/or kV according to patient size and/or use of iterative reconstruction technique. CONTRAST:   75mL OMNIPAQUE  IOHEXOL  350 MG/ML SOLN COMPARISON:  Chest x-ray 06/17/2024 FINDINGS: Cardiovascular: Non contrasted images of the chest demonstrate no acute intramural hematoma. Negative for aortic aneurysm or dissection. Mild atherosclerosis. Normal cardiac size. No pericardial effusion. Mild coronary vascular calcification. Though not specifically tailored to assess for PE, no definitive filling defects are visualized within the central pulmonary vessels. Mediastinum/Nodes: Patent trachea. No thyroid mass. Esophagus within normal limits. Mildly prominent precarinal lymph node measuring 11 mm. Subcarinal lymph node measures 15 mm. AP window nodes measuring up to 11 mm. Lungs/Pleura: Emphysema. No acute consolidation, pleural effusion, or pneumothorax. Minimal right lower lobe subpleural ground-glass disease. Upper Abdomen: No acute finding Musculoskeletal: No acute osseous abnormality Review of the MIP images confirms the above findings. IMPRESSION: 1. Negative for acute aortic dissection or aneurysm. 2. Emphysema. Minimal right lower lobe subpleural ground-glass disease, possible mild infectious or inflammatory change. 3. Mildly enlarged mediastinal lymph nodes, nonspecific. Aortic Atherosclerosis (ICD10-I70.0) and Emphysema (ICD10-J43.9). Electronically Signed   By: Luke Bun M.D.   On: 06/17/2024 18:58   DG Hip Unilat W or Wo Pelvis 2-3 Views Left Result Date: 06/17/2024 CLINICAL DATA:  Left hip pain. EXAM: DG HIP (WITH OR WITHOUT PELVIS) 2-3V LEFT COMPARISON:  None Available. FINDINGS: Slight left hip joint space narrowing with acetabular spurring. Suspected os acetabula. No fracture. No evidence of erosion or avascular necrosis. No focal bone lesion or bone destruction. Pubic symphysis and sacroiliac joints are congruent. IMPRESSION: Mild osteoarthritis of the left hip. Electronically Signed   By: Andrea Gasman M.D.   On: 06/17/2024 17:09   DG Chest 2 View Result Date: 06/17/2024 CLINICAL DATA:   Left-sided chest pain. EXAM: CHEST - 2 VIEW COMPARISON:  12/08/2023 FINDINGS: The cardiomediastinal contours are normal. Chronic central bronchial thickening. Probable apical emphysema. Pulmonary vasculature is normal. No consolidation, pleural effusion, or pneumothorax. No acute osseous abnormalities are seen. IMPRESSION: No acute findings. Chronic bronchial thickening and probable apical emphysema. Electronically Signed   By: Andrea Gasman M.D.   On: 06/17/2024 17:08    Procedures Procedures  {Document cardiac monitor, telemetry assessment procedure when appropriate:1}  Medications Ordered in ED Medications  iohexol  (OMNIPAQUE ) 350 MG/ML injection 100 mL (75 mLs Intravenous Contrast Given 06/17/24 1810)    ED Course/ Medical Decision Making/ A&P                          Medical Decision Making Amount and/or Complexity of Data Reviewed Labs: ordered. Radiology: ordered. Decision-making details documented in ED Course.  Risk Prescription drug management.    This patient presents to the ED for concern of ***, this involves an extensive number of treatment options, and is a complaint that carries with it a high risk of complications and morbidity.  I considered the following differential and admission for this acute, potentially life threatening condition.   MDM:    ***  Clinical Course as of 06/17/24 1952  Sun Jun 17, 2024  1742 BMP, CBC, troponin  are unremarkable [HN]  1742 DG Chest 2 View No acute findings. Chronic bronchial thickening and probable apical emphysema.   [HN]  1742 DG Hip Unilat W or Wo Pelvis 2-3 Views Left Mild osteoarthritis of the left hip. [HN]  1921 CT Angio Chest Aorta W and/or Wo Contrast 1. Negative for acute aortic dissection or aneurysm. 2. Emphysema. Minimal right lower lobe subpleural ground-glass disease, possible mild infectious or inflammatory change. 3. Mildly enlarged mediastinal lymph nodes, nonspecific.  [HN]    Clinical Course User  Index [HN] Franklyn Sid SAILOR, MD    Labs: I Ordered, and personally interpreted labs.  The pertinent results include:  ***  Imaging Studies ordered: I ordered imaging studies including *** I independently visualized and interpreted imaging. I agree with the radiologist interpretation  Additional history obtained from ***.  External records from outside source obtained and reviewed including ***  Cardiac Monitoring: The patient was maintained on a cardiac monitor.  I personally viewed and interpreted the cardiac monitored which showed an underlying rhythm of: ***  Reevaluation: After the interventions noted above, I reevaluated the patient and found that they have :{resolved/improved/worsened:23923::improved}  Social Determinants of Health: ***  Disposition:  ***  Co morbidities that complicate the patient evaluation  Past Medical History:  Diagnosis Date   HOH (hard of hearing)      Medicines Meds ordered this encounter  Medications   iohexol  (OMNIPAQUE ) 350 MG/ML injection 100 mL    I have reviewed the patients home medicines and have made adjustments as needed  Problem List / ED Course: Problem List Items Addressed This Visit   None        {Document critical care time when appropriate:1} {Document review of labs and clinical decision tools ie heart score, Chads2Vasc2 etc:1}  {Document your independent review of radiology images, and any outside records:1} {Document your discussion with family members, caretakers, and with consultants:1} {Document social determinants of health affecting pt's care:1} {Document your decision making why or why not admission, treatments were needed:1}  This note was created using dictation software, which may contain spelling or grammatical errors.

## 2024-06-17 NOTE — ED Notes (Signed)
 Pt ambulatory to restroom without assistance and with a steady gait.

## 2024-06-22 ENCOUNTER — Other Ambulatory Visit: Payer: Self-pay | Admitting: Cardiology

## 2024-06-22 DIAGNOSIS — R079 Chest pain, unspecified: Secondary | ICD-10-CM

## 2024-07-04 ENCOUNTER — Ambulatory Visit (HOSPITAL_COMMUNITY)
Admission: RE | Admit: 2024-07-04 | Discharge: 2024-07-04 | Disposition: A | Discharge status: Home / Self Care | Source: Ambulatory Visit | Attending: Cardiology | Admitting: Cardiology

## 2024-07-04 ENCOUNTER — Encounter (HOSPITAL_COMMUNITY): Admission: RE | Admit: 2024-07-04 | Source: Ambulatory Visit

## 2024-07-04 ENCOUNTER — Encounter (HOSPITAL_COMMUNITY): Payer: Self-pay

## 2024-07-04 DIAGNOSIS — R079 Chest pain, unspecified: Secondary | ICD-10-CM | POA: Insufficient documentation

## 2024-07-04 MED ORDER — TECHNETIUM TC 99M TETROFOSMIN IV KIT
10.4000 | PACK | Freq: Once | INTRAVENOUS | Status: AC
  Administered 2024-07-04: 10.4 via INTRAVENOUS

## 2024-07-04 MED ORDER — TECHNETIUM TC 99M TETROFOSMIN IV KIT
30.0000 | PACK | Freq: Once | INTRAVENOUS | Status: AC | PRN
  Administered 2024-07-04: 33 via INTRAVENOUS

## 2024-07-04 MED ORDER — REGADENOSON 0.4 MG/5ML IV SOLN
0.4000 mg | Freq: Once | INTRAVENOUS | Status: AC
  Administered 2024-07-04: 0.4 mg via INTRAVENOUS

## 2024-07-04 MED ORDER — REGADENOSON 0.4 MG/5ML IV SOLN
INTRAVENOUS | Status: AC
Start: 2024-07-04 — End: 2024-07-04
  Filled 2024-07-04: qty 5

## 2024-08-24 ENCOUNTER — Ambulatory Visit: Admitting: Cardiovascular Disease
